# Patient Record
Sex: Male | Born: 1940 | Hispanic: No | Marital: Married | Smoking: Current some day smoker
Health system: Southern US, Community
[De-identification: ages and names within clinical notes are randomized; demographics above are authoritative.]

## PROBLEM LIST (undated history)

## (undated) DIAGNOSIS — J189 Pneumonia, unspecified organism: Secondary | ICD-10-CM

## (undated) DIAGNOSIS — R9389 Abnormal findings on diagnostic imaging of other specified body structures: Secondary | ICD-10-CM

## (undated) DIAGNOSIS — J449 Chronic obstructive pulmonary disease, unspecified: Secondary | ICD-10-CM

## (undated) HISTORY — PX: LAPAROTOMY: SHX154

## (undated) HISTORY — DX: Abnormal findings on diagnostic imaging of other specified body structures: R93.89

## (undated) HISTORY — DX: Pneumonia, unspecified organism: J18.9

---

## 2009-11-07 ENCOUNTER — Encounter: Admission: RE | Admit: 2009-11-07 | Discharge: 2009-11-07 | Payer: Self-pay | Admitting: Family Medicine

## 2009-11-11 ENCOUNTER — Ambulatory Visit: Payer: Self-pay | Admitting: Pulmonary Disease

## 2009-11-11 DIAGNOSIS — J438 Other emphysema: Secondary | ICD-10-CM
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
## 2010-03-16 ENCOUNTER — Encounter (HOSPITAL_COMMUNITY): Payer: Self-pay | Admitting: Radiology

## 2010-03-16 ENCOUNTER — Inpatient Hospital Stay (HOSPITAL_COMMUNITY)
Admission: EM | Admit: 2010-03-16 | Discharge: 2010-03-19 | DRG: 190 | Disposition: A | Discharge status: Home / Self Care | Payer: Medicare PPO | Attending: Internal Medicine | Admitting: Internal Medicine

## 2010-03-16 ENCOUNTER — Emergency Department (HOSPITAL_COMMUNITY): Payer: Medicare PPO

## 2010-03-16 DIAGNOSIS — R634 Abnormal weight loss: Secondary | ICD-10-CM | POA: Diagnosis present

## 2010-03-16 DIAGNOSIS — R222 Localized swelling, mass and lump, trunk: Secondary | ICD-10-CM | POA: Diagnosis present

## 2010-03-16 DIAGNOSIS — I214 Non-ST elevation (NSTEMI) myocardial infarction: Secondary | ICD-10-CM | POA: Diagnosis present

## 2010-03-16 DIAGNOSIS — R918 Other nonspecific abnormal finding of lung field: Secondary | ICD-10-CM

## 2010-03-16 DIAGNOSIS — J449 Chronic obstructive pulmonary disease, unspecified: Secondary | ICD-10-CM

## 2010-03-16 DIAGNOSIS — J441 Chronic obstructive pulmonary disease with (acute) exacerbation: Principal | ICD-10-CM | POA: Diagnosis present

## 2010-03-16 DIAGNOSIS — R0602 Shortness of breath: Secondary | ICD-10-CM

## 2010-03-16 DIAGNOSIS — R7989 Other specified abnormal findings of blood chemistry: Secondary | ICD-10-CM

## 2010-03-16 DIAGNOSIS — F17200 Nicotine dependence, unspecified, uncomplicated: Secondary | ICD-10-CM | POA: Diagnosis present

## 2010-03-16 DIAGNOSIS — IMO0002 Reserved for concepts with insufficient information to code with codable children: Secondary | ICD-10-CM

## 2010-03-16 DIAGNOSIS — Z7982 Long term (current) use of aspirin: Secondary | ICD-10-CM

## 2010-03-16 LAB — MAGNESIUM: Magnesium: 2.2 mg/dL (ref 1.5–2.5)

## 2010-03-16 LAB — BASIC METABOLIC PANEL
CO2: 26 mEq/L (ref 19–32)
Calcium: 8.8 mg/dL (ref 8.4–10.5)
Chloride: 101 mEq/L (ref 96–112)
GFR calc Af Amer: >60 mL/min (ref >60)
Glucose, Bld: 143 mg/dL — ABNORMAL HIGH (ref 70–99)
Sodium: 138 mEq/L (ref 135–145)

## 2010-03-16 LAB — POCT I-STAT 3, ART BLOOD GAS (G3+)
O2 Saturation: 93 %
TCO2: 26 mmol/L (ref 0–100)
pCO2 arterial: 46.1 mmHg — ABNORMAL HIGH (ref 35.0–45.0)
pH, Arterial: 7.343 — ABNORMAL LOW (ref 7.350–7.450)
pO2, Arterial: 72 mmHg — ABNORMAL LOW (ref 80.0–100.0)

## 2010-03-16 LAB — CARDIAC PANEL(CRET KIN+CKTOT+MB+TROPI)
CK, MB: 20.6 ng/mL (ref 0.3–4.0)
Relative Index: 1.1 (ref 0.0–2.5)
Relative Index: 1.7 (ref 0.0–2.5)
Total CK: 1194 U/L — ABNORMAL HIGH (ref 7–232)
Troponin I: 1.61 ng/mL (ref 0.00–0.06)

## 2010-03-16 LAB — CK TOTAL AND CKMB (NOT AT ARMC)
CK, MB: 10.9 ng/mL (ref 0.3–4.0)
Total CK: 1132 U/L — ABNORMAL HIGH (ref 7–232)

## 2010-03-16 LAB — DIFFERENTIAL
Basophils Absolute: 0 10*3/uL (ref 0.0–0.1)
Basophils Relative: 0 % (ref 0–1)
Lymphocytes Relative: 20 % (ref 12–46)
Monocytes Absolute: 0.7 10*3/uL (ref 0.1–1.0)
Monocytes Relative: 7 % (ref 3–12)
Neutro Abs: 7 10*3/uL (ref 1.7–7.7)
Neutrophils Relative %: 65 % (ref 43–77)

## 2010-03-16 LAB — TROPONIN I: Troponin I: 0.77 ng/mL (ref 0.00–0.06)

## 2010-03-16 LAB — CBC
HCT: 45.2 % (ref 39.0–52.0)
Hemoglobin: 15.2 g/dL (ref 13.0–17.0)
MCH: 30.1 pg (ref 26.0–34.0)
MCHC: 33.6 g/dL (ref 30.0–36.0)
RBC: 5.05 MIL/uL (ref 4.22–5.81)

## 2010-03-16 LAB — HEPARIN LEVEL (UNFRACTIONATED): Heparin Unfractionated: <0.1 IU/mL — ABNORMAL LOW (ref 0.30–0.70)

## 2010-03-16 LAB — PHOSPHORUS: Phosphorus: 2.1 mg/dL — ABNORMAL LOW (ref 2.3–4.6)

## 2010-03-16 MED ORDER — IOHEXOL 300 MG/ML  SOLN: 100.0000 mL | Freq: Once | INTRAMUSCULAR | Status: AC | PRN

## 2010-03-17 DIAGNOSIS — R0602 Shortness of breath: Secondary | ICD-10-CM

## 2010-03-17 DIAGNOSIS — R05 Cough: Secondary | ICD-10-CM

## 2010-03-17 DIAGNOSIS — J441 Chronic obstructive pulmonary disease with (acute) exacerbation: Secondary | ICD-10-CM

## 2010-03-17 LAB — COMPREHENSIVE METABOLIC PANEL
Albumin: 3.1 g/dL — ABNORMAL LOW (ref 3.5–5.2)
BUN: 10 mg/dL (ref 6–23)
Calcium: 8.9 mg/dL (ref 8.4–10.5)
Chloride: 104 mEq/L (ref 96–112)
Creatinine, Ser: 0.79 mg/dL (ref 0.4–1.5)
GFR calc Af Amer: >60 mL/min (ref >60)
Total Bilirubin: 0.6 mg/dL (ref 0.3–1.2)

## 2010-03-17 LAB — LIPID PANEL
HDL: 46 mg/dL (ref >39)
LDL Cholesterol: 72 mg/dL (ref 0–99)
Triglycerides: 32 mg/dL (ref <150)
VLDL: 6 mg/dL (ref 0–40)

## 2010-03-17 LAB — CBC
MCH: 29.7 pg (ref 26.0–34.0)
MCHC: 33.8 g/dL (ref 30.0–36.0)
Platelets: 203 10*3/uL (ref 150–400)
RDW: 14.4 % (ref 11.5–15.5)

## 2010-03-17 LAB — HEPARIN LEVEL (UNFRACTIONATED): Heparin Unfractionated: 0.12 IU/mL — ABNORMAL LOW (ref 0.30–0.70)

## 2010-03-18 ENCOUNTER — Other Ambulatory Visit (HOSPITAL_COMMUNITY): Payer: Medicare PPO

## 2010-03-18 ENCOUNTER — Telehealth: Payer: Self-pay | Admitting: Pulmonary Disease

## 2010-03-18 DIAGNOSIS — D381 Neoplasm of uncertain behavior of trachea, bronchus and lung: Secondary | ICD-10-CM

## 2010-03-18 LAB — CBC
HCT: 34.7 % — ABNORMAL LOW (ref 39.0–52.0)
MCHC: 33.7 g/dL (ref 30.0–36.0)
MCV: 88.1 fL (ref 78.0–100.0)
Platelets: 186 10*3/uL (ref 150–400)
RDW: 14.7 % (ref 11.5–15.5)

## 2010-03-19 ENCOUNTER — Inpatient Hospital Stay (HOSPITAL_COMMUNITY): Payer: Medicare PPO

## 2010-03-19 LAB — CBC
MCH: 29.9 pg (ref 26.0–34.0)
MCV: 90 fL (ref 78.0–100.0)
Platelets: 179 10*3/uL (ref 150–400)
RDW: 15 % (ref 11.5–15.5)

## 2010-03-21 ENCOUNTER — Telehealth: Payer: Self-pay | Admitting: Pulmonary Disease

## 2010-03-23 NOTE — H&P (Signed)
NAMEJESS, TONEY               ACCOUNT NO.:  [id number]  MEDICAL RECORD NO.:  [id number]           PATIENT TYPE:  E  LOCATION:  [facility]                         FACILITY:  [facility]  PHYSICIAN:  Michiel Cowboy, MDDATE OF BIRTH:  12-09-1940  DATE OF ADMISSION:  03/16/2010 DATE OF DISCHARGE:                             HISTORY & PHYSICAL   PRIMARY CARE PROVIDER:  Dr. Winfield Cunas at [facility].  CHIEF COMPLAINT:  Sudden onset of shortness of breath.  The patient is a 70 year old gentleman with history of COPD, continues to smoke.  He has had some cough for quite some time, but no fevers or chills.  He was sleeping at 1 a.m., he developed sudden onset of severe onset of coughing and shortness of breath.  He could not catch his breath.  He tried to use albuterol inhaler, but was found himself unable to do so secondary to severe shortness of breath.  He finally called out for help and EMS was called and he was brought into the emergency department.  En route, he was given IV steroids.  At first, his respiratory distress was severe and it was thought to be even he may need to be resuscitated, but he was able to improve on Xopenex nebs and actually currently almost back to his baseline.  He is resting on 2 liters of oxygen, able to speak in complete sentences, although still has some wheezing, but in no respiratory distress, whatsoever.  [facility] was called for admission.  The patient does endorse 20-pound weight loss.  Absolutely, no chest pain associated with this.  No lower extremity edema.  No abdominal discomfort.  No other complaints.  PAST MEDICAL HISTORY:  Mainly significant for COPD.  He denies any other medical history.  SOCIAL HISTORY:  The patient continues to smoke, but will quit time and very encouraged to discontinue smoking.  FAMILY HISTORY:  Noncontributory.  ALLERGIES:  No known drug allergies.  MEDICATIONS: 1. Advair 150 mcg twice a day. 2.  Albuterol as needed. 3. Spiriva daily.  PHYSICAL EXAMINATION:  VITAL SIGNS:  Temperature 97.7, blood pressure 149/88, pulse 118, respirations 24, and saturating 97% on 2 liters. GENERAL:  The patient currently appears to be in no acute distress.  He does get slightly short winded in the long sentences. HEAD:  Nontraumatic.  Slightly dryish mucous membranes. LUNGS:  Distant breath sounds with wheezes bilaterally. HEART:  Regular rate and rhythm, but rapid. ABDOMEN:  Soft and nondistended. EXTREMITIES:  Lower extremities without clubbing, cyanosis, or edema. NEUROLOGICAL:  Grossly intact.  Overall, the patient appears to be frail and thin.  LABORATORY DATA:  White blood cell count 10.7 and hemoglobin 15.5. Sodium 138, potassium 4.0, creatinine 1.09.  Chest x-ray showing infection versus edema and COPD.  ASSESSMENT/PLAN:  This is a 70 year old gentleman, most likely chronic obstructive pulmonary disease exacerbation, but given sudden onset we will also evaluate for other things as well.  We will treat his COPD with steroids IV and Xopenex.  We will try to avoid albuterol given his tachycardia, Atrovent and continue his Advair.  He may benefit from pulmonology consult.  He strongly feels that he is going to be able to quit smoking, but we will provide tobacco cessation consult and also nicotine patch.  We will put him on Avelox for typical infection coverage.  Give Mucinex and oxygen.  The patient will need to go home with nebulizers and maybe even home O2.  Given his weight loss and sudden onset of his symptoms, we will obtain CTA of the chest to evaluate for PE and also evaluate for any masses given he has high risk for carcinoma.  Elevated blood pressure is currently resolved that was brought in the setting of distress.  Prophylaxis.  Protonix and Lovenox.  We will cycle cardiac markers given sudden onset shortness of breath, although mostly suspect this is COPD related.  The  patient also may have a pulmonologist, Dr. Shelle Iron.  He is not sure about the name.     Michiel Cowboy, MD     AVD/MEDQ  D:  03/16/2010  T:  03/16/2010  Job:  [id number]  cc:   [name]  Electronically Signed by Therisa Doyne MD on 03/22/2010 07:26:36 PM

## 2010-03-24 ENCOUNTER — Inpatient Hospital Stay (INDEPENDENT_AMBULATORY_CARE_PROVIDER_SITE_OTHER): Payer: Medicare PPO | Admitting: Adult Health

## 2010-03-24 ENCOUNTER — Encounter: Payer: Self-pay | Admitting: Adult Health

## 2010-03-24 DIAGNOSIS — J438 Other emphysema: Secondary | ICD-10-CM

## 2010-03-24 DIAGNOSIS — J984 Other disorders of lung: Secondary | ICD-10-CM

## 2010-03-25 DIAGNOSIS — R911 Solitary pulmonary nodule: Secondary | ICD-10-CM | POA: Insufficient documentation

## 2010-03-27 ENCOUNTER — Encounter (HOSPITAL_COMMUNITY)
Admit: 2010-03-27 | Discharge: 2010-03-27 | Disposition: A | Discharge status: Home / Self Care | Payer: Medicare PPO | Source: Ambulatory Visit | Attending: Internal Medicine | Admitting: Internal Medicine

## 2010-03-27 ENCOUNTER — Encounter (HOSPITAL_COMMUNITY): Payer: Self-pay

## 2010-03-27 DIAGNOSIS — J438 Other emphysema: Secondary | ICD-10-CM | POA: Insufficient documentation

## 2010-03-27 DIAGNOSIS — R222 Localized swelling, mass and lump, trunk: Secondary | ICD-10-CM | POA: Insufficient documentation

## 2010-03-27 DIAGNOSIS — K5730 Diverticulosis of large intestine without perforation or abscess without bleeding: Secondary | ICD-10-CM | POA: Insufficient documentation

## 2010-03-27 DIAGNOSIS — R0602 Shortness of breath: Secondary | ICD-10-CM | POA: Insufficient documentation

## 2010-03-27 MED ORDER — FLUDEOXYGLUCOSE F - 18 (FDG) INJECTION
18.2000 | Freq: Once | INTRAVENOUS | Status: AC | PRN
  Administered 2010-03-27: 18.2 via INTRAVENOUS

## 2010-03-27 NOTE — Progress Notes (Signed)
Summary: Antibiotic  Phone Note Call from Patient Call back at Home Phone [phone] Call back at [phone]   Caller: Patient Reason for Call: Referral Summary of Call: Was discharged from [facility] yesterday, was prescribed very expensive antibiotic ($20/pill), and wants to know if he can avoid taking two more until patient comes for visit on Monday. Initial call taken by: Leonette Monarch,  March 21, 2010 12:07 PM  Follow-up for Phone Call        spoke with and he states that he cannot afford avelox and he needs 2 days worth. I left samples at front for pt. he has hfu on monday.Carron Curie CMA  March 21, 2010 12:37 PM

## 2010-03-27 NOTE — Progress Notes (Signed)
Summary: which dr for pt? FYI  Phone Note From Other Clinic   Caller: pete babcock Call For: Chad Velazquez / wert Summary of Call: Just an FYI for dr's. pete has scheduled a HFU w/ tp on 2/13. he also requested that pt have an ov w/ wert which i scheduled for 2/28. i did explain to pete that this is a pt of [facility]. pete says wert had been following pt and requested that i go ahead and schedule this.  Initial call taken by: Tivis Ringer, CNA,  March 18, 2010 11:24 AM  Follow-up for Phone Call        [facility] requested this pt be put on Dr. Sherene Sires schedule for a HFU, but he saw you in October for a new consult.  Nicholos Johns said that MW saw the pt in the hospital, which I do not see any records of. Do you know anything about this? Shouldn't this pt be placed on your schedule? Please advise. Carron Curie CMA  March 18, 2010 1:27 PM   Additional Follow-up for Phone Call Additional follow up Details #1::        I have seen the pt in the office and am happy to continue seeing him for continuity.  Would go ahead and put on my schedule unless pt wishes to see wert instead of me. Additional Follow-up by: Barbaraann Share MD,  March 18, 2010 2:19 PM    Additional Follow-up for Phone Call Additional follow up Details #2::    Pt appt changed from MW to [facility]. Pt set for 04-09-10 at 3:00. I called [facility] pt is in room # [phone] and advised his nurse of the change in appt time and date. She will notify the pt. Carron Curie CMA  March 18, 2010 2:52 PM

## 2010-03-28 ENCOUNTER — Telehealth: Payer: Self-pay | Admitting: Internal Medicine

## 2010-03-31 ENCOUNTER — Encounter: Payer: Medicare PPO | Admitting: Internal Medicine
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
## 2010-04-02 NOTE — Progress Notes (Signed)
Summary: results/ pt returned call  Phone Note Call from Patient   Caller: Patient Call For: Chad Velazquez Summary of Call: pt returned call from nurse re: results. call [phone] Initial call taken by: Tivis Ringer, CNA,  March 28, 2010 3:07 PM  Follow-up for Phone Call        called and spoke with pt and he is aware per MW of cxr results and appt has been made on 2/27 at 9:45- to discuss meds for copd--pt requested that he just cont to see MW and cancel the appt with [facility] on 2/29.   Randell Loop [facility]  March 28, 2010 3:51 PM
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
## 2010-04-07 ENCOUNTER — Encounter: Payer: Self-pay | Admitting: Internal Medicine

## 2010-04-07 ENCOUNTER — Ambulatory Visit (INDEPENDENT_AMBULATORY_CARE_PROVIDER_SITE_OTHER): Payer: Medicare PPO | Admitting: Internal Medicine

## 2010-04-07 DIAGNOSIS — F17200 Nicotine dependence, unspecified, uncomplicated: Secondary | ICD-10-CM | POA: Insufficient documentation

## 2010-04-07 DIAGNOSIS — J438 Other emphysema: Secondary | ICD-10-CM

## 2010-04-08 ENCOUNTER — Ambulatory Visit: Payer: Medicare PPO | Admitting: Internal Medicine

## 2010-04-09 ENCOUNTER — Ambulatory Visit: Payer: Medicare PPO | Admitting: Pulmonary Disease

## 2010-04-17 NOTE — Assessment & Plan Note (Signed)
Summary: Pulmonary/ ext post hosp f/u ov hfa 75%   Copy to:  [name] Primary Provider/Referring Provider:  [name] MD  CC:  Cough and edema- resolved.  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  April 07, 2010  1st pulmonary office eval post hosp now maintained spiriva and symbicort with no symptoms at f/u including no limiting sob, no cough. Pt denies any significant sore throat, dysphagia, itching, sneezing,  nasal congestion or excess secretions,  fever, chills, sweats, unintended wt loss, pleuritic or exertional cp, hempoptysis, change in activity tolerance  orthopnea pnd or leg swelling Pt also denies any obvious fluctuation in symptoms with weather or environmental change or other alleviating or aggravating factors.       Preventive Screening-Counseling & Management  Alcohol-Tobacco     Smoking Status: current     Smoking Cessation Counseling: yes  Current Medications (verified): 1)  Spiriva Handihaler 18 Mcg  Caps (Tiotropium Bromide Monohydrate) .... One Puff  in Handihaler Daily 2)  Symbicort 160-4.5 Mcg/act Aero (Budesonide-Formoterol Fumarate) .... Inhale 2 Puffs Two Times A Day 3)  Diltiazem Hcl Er Beads 180 Mg Xr24h-Cap (Diltiazem Hcl Er Beads) .... Take 1 Capsule By Mouth Once A Day 4)  Mucinex Dm 30-600 Mg Xr12h-Tab (Dextromethorphan-Guaifenesin) .... Take 1-2 Tablets Every 12 Hours As Needed 5)  Pantoprazole Sodium 40 Mg Tbec (Pantoprazole Sodium) .... Take 1 Tablet By Mouth Once A Day Before First Meal of The Day 6)  Pravachol 40 Mg Tabs (Pravastatin Sodium) .... Take 1 Tab By Mouth At Bedtime 7)  Alprazolam 0.5 Mg Tabs (Alprazolam) .... 1/2 Once Daily As Needed 8)  Multivitamins   Tabs (Multiple Vitamin) .... Take 1 Tablet By Mouth Once A Day 9)  Proair Hfa 108 (90 Base) Mcg/act  Aers (Albuterol Sulfate) .... 2 Puffs Every 4-6 Hours As Needed 10)   Prednisone 10 Mg Tabs (Prednisone) .Marland Kitchen.. 1 Once Daily 11)  Px Enteric Aspirin 81 Mg Tbec (Aspirin) .Marland Kitchen.. 1 Once Daily  Allergies (verified): No Known Drug Allergies  Past History:  Past Medical History: Emphysema      - PFT's April 07, 2010  Abn CT chest      - F/u PET c/w resolving pna 03/27/2010  Family History: Reviewed history from 11/11/2009 and no changes required. 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 1 ppd. Start age 41 Smoking Status:  current  Vital Signs:  Patient profile:   70 year old male Weight:      141.25 pounds O2 Sat:      97 % on Room air Temp:     97.6 degrees F oral Pulse rate:   89 / minute BP sitting:   140 / 74  (left arm)  Vitals Entered By: Vernie Murders (April 07, 2010 9:48 AM)  O2 Flow:  Room air  Physical Exam  Additional Exam:  wt 141 April 07, 2010 HEENT mild turbinate edema.  Oropharynx no thrush or excess pnd or cobblestoning.  No JVD or cervical adenopathy. Mild accessory muscle hypertrophy. Trachea midline, nl thryroid. Chest was hyperinflated by percussion with diminished breath sounds and moderate increased exp time without wheeze. Hoover sign positive at mid inspiration. Regular rate and rhythm without murmur gallop or rub or increase P2 or edema.  Abd: no hsm, nl excursion. Ext warm without cyanosis or  clubbing.     Impression & Recommendations:  Problem # 1:  EMPHYSEMA (ICD-492.8)  clinically moderate but needs pft's for staging    DDX of  difficult airways managment all start with A and  include Adherence, Ace Inhibitors, Acid Reflux, Active Sinus Disease, Alpha 1 Antitripsin deficiency, Anxiety masquerading as Airways dz,  ABPA,  allergy(esp in young), Aspiration (esp in elderly), Adverse effects of DPI,  Active smokers, plus two Bs  = Bronchiectasis and Beta blocker use..and one C= CHF   Active smoking obviously the biggest concern  here see #2  Adherence:  I spent extra time with the patient today explaining optimal mdi  technique.  This improved from  50-75%  Orders: Est. Patient Level IV (16109) HFA Instruction [phone])  Problem # 2:  SMOKER (ICD-305.1)  I emphasized that although we never turn away smokers from the pulmonary clinic, we do ask that they understand that the recommendations that were made won't work nearly as well in the presence of continued cigarette exposure and we may reach a point where we can't help the patient if he/she can't quit smoking.    Medications Added to Medication List This Visit: 1)  Alprazolam 0.5 Mg Tabs (Alprazolam) .... 1/2 once daily as needed 2)  Prednisone 10 Mg Tabs (Prednisone) .Marland Kitchen.. 1 once daily 3)  Px Enteric Aspirin 81 Mg Tbec (Aspirin) .Marland Kitchen.. 1 once daily  Patient Instructions: 1)  Stop smoking before it stops you 2)  Work on inhaler technique:  relax and blow all the way out then take a nice smooth deep breath back in, triggering the inhaler at same time you start breathing in  3)  Prednsione 10 mg  one half daily x 5 days stop  4)  Please schedule a follow-up appointment in 4-6 weeks, sooner if needed with pft's and cxr on return.

## 2010-04-30 NOTE — Discharge Summary (Signed)
Chad Velazquez, LEITZ               ACCOUNT NO.:  [id number]  MEDICAL RECORD NO.:  [id number]           PATIENT TYPE:  I  LOCATION:  4735                         FACILITY:  [facility]  PHYSICIAN:  Casimiro Needle B. Sherene Sires, MD, FCCPDATE OF BIRTH:  06/[birth year]/39  DATE OF ADMISSION:  03/16/2010 DATE OF DISCHARGE:  03/19/2010                              DISCHARGE SUMMARY   DISCHARGE DIAGNOSES: 1. Acute exacerbation of chronic obstructive pulmonary disease. 2. Non-ST-elevation myocardial infarction. 3. Left apical lung mass.  CONSULTANTS: 1. Bevelyn Buckles. Bensimhon, MD, with [facility]. 2. Rachelle Hora. Lowella Dandy, MD, with Interventional Rad[facility].  LABORATORY DATA:  Date March 19, 2010, white blood cell count 17.3, hemoglobin 12, hematocrit 36.1, platelet count 179,000.  Date March 17, 2010, sodium 137, potassium 4.3, chloride 104, CO2 24, glucose 132, BUN is 10, creatinine 0.79.  Echocardiogram, this was obtained on March 17, 2010, the results are left ventricular cavity wall size was normal, EF was 60%.  There was no TR.  The IVC was dilated.  RADIOLOGY:  CT angiogram on March 16, 2010, this demonstrated a large mass-like area in the left apex and this was located anteriorly.  There was no evidence of pulmonary emboli.  There was noted severe emphysematous changes in the long.  BRIEF HISTORY:  A 70 year old male patient with a known history of chronic obstructive pulmonary disease and active smoking, presents with chief complaint of cough and sudden onset of severe coughing and dyspnea which woke him up at approximately 1 a.m. on hospital admit day on March 16, 2010.  He had resultant severe dyspnea.  This did not respond to his rescue bronchodilator therapy.  He therefore summoned EMS.  He was transferred to the emergency room at [facility]. He was given inhaled bronchodilators and IV steroids en route.  He continued to improve over the course of his emergency room  visit, however, required hospital at admit for further titration of therapy.  HOSPITAL COURSE BY DISCHARGE DIAGNOSES: 1. Acute exacerbation of chronic obstructive pulmonary disease.  Mr.     [name] was admitted to the regular medical ward.  Therapeutic     interventions included supplemental oxygen, inhaled     bronchodilators, systemic steroids, and empiric antibiotics.  He     continued to improve over the course of his hospitalization.     Please note, there was a significant component of what appeared to     be upper airway irritation/probable vocal cord dysfunction     complicating Mr. [name]' progress.  Because of this, his powder     inhalers were discontinued.  He had been on Advair in the     outpatient setting which may have been contributing to upper airway     irritation.  He has had some difficulty with medication compliance     in the outpatient setting, particularly with his inhaler regimen.     Ultimately, he continued to improve over the course of his     hospitalization.  He has been since transitioned to what will be     his maintenance bronchodilator regimen which will be a triple combo  therapy including inhaled anticholinergics, inhaled     corticosteroids, and long-acting beta-agonists.  Additionally, of     course he will be sent with recommendations to continue short-     acting beta-agonists for rescue dyspnea.  He will continue a slow     prednisone taper, and finish two more days of empiric antibiotics     in the form of Avelox.  He again has been reminded of the     importance of smoking cessation, and he will be followed through     the outpatient setting in our office for further COPD management. 2. Acute non-ST-elevation MI.  This was seen primarily from elevated     cardiac enzymes during his hospitalization.  Please note, he was     asymptomatic with the exception of dyspnea.  Because of this,     [facility] was obtained.  Echocardiogram was  completed.  He     will be followed by [name] in the outpatient setting     for possible catheterization pending the results of his PET scan.     For now, he will be discharged to home on calcium channel blocker,     Pravachol, and aspirin 81 mg daily. 3. Left apical lung mass.  This was identified by CAT scan.  He has a     PET scan ordered in the outpatient setting to help better evaluate     the potential for malignancy.  This is scheduled for March 27, 2010, at 10 a.m.  Following that, he will be followed by     Interventional Radiology.  He has been seen by Dr. Richarda Overlie in     evaluation for this.  His discharge instructions are as follows: 1. Increase activity as tolerated. 2. Stop smoking. 3. Follow up nurse practitioner, [name], Monday, March 24, 2010, at 2 p.m.; [name] on April 09, 2010, at 3 p.m.;     [name] on April 01, 2010, at 2 p.m.  Additionally,     he has his PET scan scheduled for March 27, 2010, at 10 a.m. at     [facility].  He has been instructed to be n.p.o. 6 hours     prior to his PET scan.  Discharge medications are as follows: 1. Budesonide/formoterol (Symbicort) 160/4.5 two puffs inhaled twice a     day. 2. Diltiazem ER 180 mg daily. 3. Mucinex DM 2 tablets twice a day. 4. Avelox 400 mg daily for 2 days. 5. Pravachol 40 mg daily. 6. Pantoprazole 40 mg daily. 7. Prednisone taper 10 mg tablets, instructed to take 4 tablets daily     for 3 days, 3 tablets daily for 3 days, 2 tablets daily for 3 days,     1 tablet daily for 3 days, then discontinue. 8. Albuterol inhaler 2 puffs every 4 hours as needed. 9. Multivitamin daily. 10.Spiriva 18 mcg cap inhaled daily. 11.Aspirin 81 mg daily.  He has been instructed to discontinue his Advair.  DISPOSITION:  [name] has met maximum benefit from inpatient care. He is now medically cleared for discharge with follow up as  mentioned above.  Please note 30 minutes of time have been dedicated to discharge assessment and plan.     Zenia Resides, NP   ______________________________ Charlaine Dalton. Sherene Sires, MD, FCCP    PB/MEDQ  D:  03/19/2010  T:  03/19/2010  Job:  [id number]  cc:   Barbaraann Share, MD,FCCP Arn Medal, MD Bevelyn Buckles. Bensimhon, MD  Electronically Signed by Zenia Resides NP on 04/28/2010 09:59:49 PM Electronically Signed by Sandrea Hughs MD FCCP on 04/30/2010 09:53:39 PM

## 2010-08-22 ENCOUNTER — Telehealth: Payer: Self-pay | Admitting: Internal Medicine

## 2010-08-22 NOTE — Telephone Encounter (Signed)
I spoke with the pt and he states that the spiriva and proair is too expensive and is requesting alternatives. I advised the pt that he is overdue for an appt. So I advised to bring his drug formulary with him and appt set for 08/27/10 at 2pm. Pt saw [facility] for consult and then [facility] for HFU but states he wants to see [facility] not [facility]. Carron Curie, CMA

## 2010-08-26 ENCOUNTER — Encounter: Payer: Self-pay | Admitting: Pulmonary Disease

## 2010-08-27 ENCOUNTER — Encounter: Payer: Self-pay | Admitting: Pulmonary Disease

## 2010-08-27 ENCOUNTER — Ambulatory Visit (INDEPENDENT_AMBULATORY_CARE_PROVIDER_SITE_OTHER): Payer: Medicare PPO | Admitting: Pulmonary Disease

## 2010-08-27 VITALS — BP 130/80 | HR 103 | Temp 98.0°F | Ht 72.5 in | Wt 128.6 lb

## 2010-08-27 DIAGNOSIS — J984 Other disorders of lung: Secondary | ICD-10-CM

## 2010-08-27 DIAGNOSIS — J438 Other emphysema: Secondary | ICD-10-CM

## 2010-08-27 MED ORDER — ALBUTEROL SULFATE HFA 108 (90 BASE) MCG/ACT IN AERS: 2.0000 | INHALATION_SPRAY | Freq: Four times a day (QID) | RESPIRATORY_TRACT | Status: DC | PRN

## 2010-08-27 MED ORDER — BUPROPION HCL ER (SR) 150 MG PO TB12: ORAL_TABLET | ORAL | Status: DC

## 2010-08-27 NOTE — Patient Instructions (Signed)
Will keep on spiriva one each am, ventolin for rescue, and see if we can get you on symbicort thru the patient assistance program.  We do not have samples of symbicort today, so will give you dulera 100/5  2 inhalations am and pm in the interim.  Keep mouth rinsed. Take wellbutrin as directed, and work on smoking cessation. followup with me in 6mos  If doing well.

## 2010-08-27 NOTE — Progress Notes (Signed)
  Subjective:    Patient ID: Chad Velazquez, male    DOB: 11/22/1940, 70 y.o.   MRN: [id number]  HPI The pt comes in today for f/u of his copd.  He is still smoking, and now having financial issues which inteferes with getting meds. His chronic doe is at baseline, as is his cough with nonpurulent mucus.  He has not had an acute exacerbation since his hospitalization earlier in the year.  He wishes to discuss medication alternatives that may be less expensive.    Review of Systems  Constitutional: Negative for fever and unexpected weight change.  HENT: Negative for ear pain, nosebleeds, congestion, sore throat, rhinorrhea, sneezing, trouble swallowing, dental problem, postnasal drip and sinus pressure.   Eyes: Negative for redness and itching.  Respiratory: Positive for cough and shortness of breath. Negative for chest tightness and wheezing.   Cardiovascular: Negative for palpitations and leg swelling.  Gastrointestinal: Negative for nausea and vomiting.  Genitourinary: Negative for dysuria.  Musculoskeletal: Negative for joint swelling.  Skin: Negative for rash.  Neurological: Negative for headaches.  Hematological: Does not bruise/bleed easily.  Psychiatric/Behavioral: Negative for dysphoric mood. The patient is not nervous/anxious.        Objective:   Physical Exam Thin male in nad Nares without purulence or discharge Chest with decreased bs, no wheezing or rhonchi Cor with rrr, no mrg LE without edema, no cyanosis Alert, oriented, moves all 4        Assessment & Plan:

## 2010-08-30 NOTE — Assessment & Plan Note (Signed)
The pt has emphysema on clinical grounds with significant doe.  Unfortunately, he continues to smoke.  He is having financial difficulties, and issues affording his medications.  I would like him to stay on spiriva and LABA/ICS, and will try to get him on some type of pt assistance program.  I did re-iterate to him smoking cessation is the most important treatment, and that he can take the money saved and put toward his medications. I have had a long discussion with him about management, the importance of avoiding acute exacerbations, and the role of ongoing tobacco abuse.  Time spent with pt today in discussions was .

## 2010-09-29 ENCOUNTER — Telehealth: Payer: Self-pay | Admitting: Pulmonary Disease

## 2010-09-29 MED ORDER — TIOTROPIUM BROMIDE MONOHYDRATE 18 MCG IN CAPS: 18.0000 ug | ORAL_CAPSULE | Freq: Every day | RESPIRATORY_TRACT | Status: DC

## 2010-09-29 NOTE — Telephone Encounter (Signed)
Pt calling for prescriptions to be sent to pharmacy for Spiriva and Symbicort. I sent in Spiriva electronic, however, need verification on Symbicort strength as per pt is 80 and our records indicate an "error" of 160. KC please advise on Symbicort.   FYI-Pt states he has been "sitting on his paperwork" x 3 weeks for the patient assistance program for Symbicort and will mail it tomorrow. Pt states feels like he will qualify for Symbicort and not Spiriva. Has "17 days" left for Durlera samples and is completely out of Spirva.

## 2010-09-29 NOTE — Telephone Encounter (Signed)
Everything I see is 160/4.5 in chart.  I wonder if he got samples from [place] for that because that is all we had, or if another md gave him this.  Needs to be 160/4.5

## 2010-09-30 NOTE — Telephone Encounter (Signed)
lmomtcb x1 

## 2010-10-01 ENCOUNTER — Telehealth: Payer: Self-pay | Admitting: Pulmonary Disease

## 2010-10-01 ENCOUNTER — Encounter: Payer: Self-pay | Admitting: Emergency Medicine

## 2010-10-01 ENCOUNTER — Ambulatory Visit (INDEPENDENT_AMBULATORY_CARE_PROVIDER_SITE_OTHER): Payer: Medicare PPO | Admitting: Emergency Medicine

## 2010-10-01 VITALS — BP 118/66 | HR 106 | Temp 98.4°F | Ht 73.0 in | Wt 125.8 lb

## 2010-10-01 DIAGNOSIS — J438 Other emphysema: Secondary | ICD-10-CM

## 2010-10-01 MED ORDER — MOXIFLOXACIN HCL 400 MG PO TABS: 400.0000 mg | ORAL_TABLET | Freq: Every day | ORAL | Status: AC

## 2010-10-01 NOTE — Assessment & Plan Note (Addendum)
Suspect bronchitis after a viral URI. Must also consider him at risk for a recurrent bullitis - avelox x 7 days - recommended a CXR but he wants to defer - underscored that a bullitis may need to be treated longer than a week and that he will have to give feedback to Dr Shelle Iron about whether he is better - rov w [facility] as already scheduled

## 2010-10-01 NOTE — Telephone Encounter (Signed)
Pt scheduled to see RB this afternoon at 3 pm.

## 2010-10-01 NOTE — Patient Instructions (Signed)
Please take Avelox 400mg  daily for the next 7 days. Please contact our office if you symptoms are not improving after the medication is completed.  Follow up with Dr Shelle Iron as already scheduled, or sooner if you have any problems.

## 2010-10-01 NOTE — Progress Notes (Signed)
  Subjective:    Patient ID: Chad Velazquez, male    DOB: [birth year], 70 y.o.   MRN: [id number]  HPI 70 yo active smoker, followed by Dr Shelle Iron for COPD, radiographical emphysema with associated DOE. 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 He presents today reporting that around 7/20 he developed chills, fever x 4 days, cough productive of dark phlegm, fatigue. Most of the illness got better, presumed it was a viral URI. He is left with a residual cough that is productive of dark yellow phlegm, more than his baseline. Remains fatigued. Not really wheezing. Some more dyspnea than baseline.     Review of Systems As above.      Objective:   Physical Exam Gen: Pleasant, thin, in no distress,  normal affect  ENT: No lesions,  mouth clear,  oropharynx clear, no postnasal drip  Neck: No JVD, no TMG, no carotid bruits  Lungs: No use of accessory muscles, minimal air movement L apex, no wheeze  Cardiovascular: RRR, heart sounds normal, no murmur or gallops, no peripheral edema  Musculoskeletal: No deformities, no cyanosis or clubbing  Neuro: alert, non focal  Skin: Warm, no lesions or rashes     Assessment & Plan:  EMPHYSEMA Suspect bronchitis after a viral URI. Must also consider him at risk for a recurrent bullitis - avelox x 7 days - recommended a CXR but he wants to defer - underscored that a bullitis may need to be treated longer than a week and that he will have to give feedback to Dr Shelle Iron about whether he is better - rov w [facility] as already scheduled

## 2010-10-01 NOTE — Telephone Encounter (Signed)
[id number]. Shereta Crothers, CMA  

## 2010-10-01 NOTE — Progress Notes (Signed)
Addended by: Leslye Peer on: 10/01/2010 03:47 PM   Modules accepted: Orders

## 2010-10-03 MED ORDER — BUDESONIDE-FORMOTEROL FUMARATE 160-4.5 MCG/ACT IN AERO: 2.0000 | INHALATION_SPRAY | Freq: Two times a day (BID) | RESPIRATORY_TRACT | Status: DC

## 2010-10-03 NOTE — Telephone Encounter (Signed)
Called, spoke with pt.  He is aware symbicort should be 160/4.5 strength and rx for this sent to [facility].  He verbalized understanding of this.

## 2010-11-11 ENCOUNTER — Ambulatory Visit (INDEPENDENT_AMBULATORY_CARE_PROVIDER_SITE_OTHER)
Admission: RE | Admit: 2010-11-11 | Discharge: 2010-11-11 | Disposition: A | Discharge status: Home / Self Care | Payer: Medicare PPO | Source: Ambulatory Visit | Attending: Adult Health | Admitting: Adult Health

## 2010-11-11 ENCOUNTER — Encounter: Payer: Self-pay | Admitting: Adult Health

## 2010-11-11 ENCOUNTER — Ambulatory Visit (INDEPENDENT_AMBULATORY_CARE_PROVIDER_SITE_OTHER): Payer: Medicare PPO | Admitting: Adult Health

## 2010-11-11 VITALS — BP 132/72 | HR 120 | Temp 97.0°F | Ht 73.0 in | Wt 122.6 lb

## 2010-11-11 DIAGNOSIS — R000 Tachycardia, unspecified: Secondary | ICD-10-CM

## 2010-11-11 DIAGNOSIS — J438 Other emphysema: Secondary | ICD-10-CM

## 2010-11-11 MED ORDER — AMOXICILLIN-POT CLAVULANATE 875-125 MG PO TABS: 1.0000 | ORAL_TABLET | Freq: Two times a day (BID) | ORAL | Status: DC

## 2010-11-11 MED ORDER — PREDNISONE 10 MG PO TABS: ORAL_TABLET | ORAL | Status: DC

## 2010-11-11 NOTE — Patient Instructions (Addendum)
Augmentin 875mg  Twice daily  For 7 days Mucinex DM Twice daily  As needed  Cough/congestion  Fluids and rest  Prednisone taper over next week.  Please contact office for sooner follow up if symptoms do not improve or worsen or seek emergency care  follow up Dr. Shelle Iron in 4 weeks and As needed    Late Add: xray returned with Bilateral UL PNA >extended Augmentin x 10 days  Pt aware, he will have close follow up in 1 week  If not improving will need to make ov sooner or be admitted.  Hold on steroid for now.

## 2010-11-11 NOTE — Progress Notes (Signed)
  Subjective:    Patient ID: Chad Velazquez, male    DOB: 14-Feb-1940, 70 y.o.   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   10/01/10  He presents today reporting that around 7/20 he developed chills, fever x 4 days, cough productive of dark phlegm, fatigue. Most of the illness got better, presumed it was a viral URI. He is left with a residual cough that is productive of dark yellow phlegm, more than his baseline. Remains fatigued. Not really wheezing. Some more dyspnea than baseline.   >>Avelox   11/11/2010 Acute OV  Pt complains of productive cough with tan colored sputum- seems to be worse at night  He also states that his chest feels sore.  Seen 6 weeks ago for bronchitis tx w/ Avelox, got better but cough never went away. Has quit smoking x 1 month.  Has increased cough esp at night. No hemoptysis. Increased wheezing and DOE for 2 days. Has increased SABA use last 2 days. No syncope or palpitations.  Today HR was noted to he tachycardiac, EKG was done w/ HR ~120. EKG w/ poor tracing however no acute changes noted.    Review of Systems Constitutional:   No  weight loss, night sweats,  Fevers, chills,  +fatigue, or  lassitude.  HEENT:   No headaches,  Difficulty swallowing,  Tooth/dental problems, or  Sore throat,                No sneezing, itching, ear ache, nasal congestion, post nasal drip,   CV:  No chest pain,  Orthopnea, PND, swelling in lower extremities, anasarca, dizziness, palpitations, syncope.   GI  No heartburn, indigestion, abdominal pain, nausea, vomiting, diarrhea, change in bowel habits, loss of appetite, bloody stools.   Resp:   No coughing up of blood.    No chest wall deformity  Skin: no rash or lesions.  GU: no dysuria, change in color of urine, no urgency or frequency.  No flank pain, no hematuria   MS:  No joint pain or swelling.  No decreased range of  motion.  No back pain.  Psych:  No change in mood or affect. No depression or anxiety.  No memory loss.          Objective:   Physical Exam GEN: A/Ox3; pleasant , NAD, well nourished   HEENT:  [place]/AT,  EACs-clear, TMs-wnl, NOSE-clear, THROAT-clear, no lesions, no postnasal drip or exudate noted.   NECK:  Supple w/ fair ROM; no JVD; normal carotid impulses w/o bruits; no thyromegaly or nodules palpated; no lymphadenopathy.  RESP  Coarse BS w/ few rhonchi, no wheeze. no accessory muscle use, no dullness to percussion  CARD:  RRR, no m/r/g  , no peripheral edema, pulses intact, no cyanosis or clubbing.  GI:   Soft & nt; nml bowel sounds; no organomegaly or masses detected.  Musco: Warm bil, no deformities or joint swelling noted.   Neuro: alert, no focal deficits noted.    Skin: Warm, no lesions or rashes         Assessment & Plan:

## 2010-11-12 NOTE — Progress Notes (Signed)
Visit reviewed, and agree with plans as outlined.

## 2010-11-12 NOTE — Assessment & Plan Note (Signed)
COPD exacerbation  Xray pending today  Tachycardia on exam, EKG w/ no acute process, suspect secondary to increased SABA use   Plan:  Augmentin 875mg  Twice daily  For 7 days Mucinex DM Twice daily  As needed  Cough/congestion  Fluids and rest  Prednisone taper over next week.  Please contact office for sooner follow up if symptoms do not improve or worsen or seek emergency care  follow up Dr. Shelle Iron in 4 weeks and As needed

## 2010-11-19 ENCOUNTER — Ambulatory Visit: Payer: Medicare PPO | Admitting: Pulmonary Disease

## 2010-11-21 ENCOUNTER — Ambulatory Visit (INDEPENDENT_AMBULATORY_CARE_PROVIDER_SITE_OTHER): Payer: Medicare PPO | Admitting: Pulmonary Disease

## 2010-11-21 ENCOUNTER — Encounter: Payer: Self-pay | Admitting: Pulmonary Disease

## 2010-11-21 DIAGNOSIS — J851 Abscess of lung with pneumonia: Secondary | ICD-10-CM

## 2010-11-21 DIAGNOSIS — J852 Abscess of lung without pneumonia: Secondary | ICD-10-CM

## 2010-11-21 DIAGNOSIS — J438 Other emphysema: Secondary | ICD-10-CM

## 2010-11-21 DIAGNOSIS — Z23 Encounter for immunization: Secondary | ICD-10-CM

## 2010-11-21 MED ORDER — AMOXICILLIN-POT CLAVULANATE 875-125 MG PO TABS: 1.0000 | ORAL_TABLET | Freq: Two times a day (BID) | ORAL | Status: AC

## 2010-11-21 NOTE — Progress Notes (Signed)
  Subjective:    Patient ID: Chad Velazquez, male    DOB: [birth year], 70 y.o.   MRN: [id number]  HPI Patient comes in today for followup of his known severe emphysema.  He was seen recently by our nurse practitioner with bilateral upper lobe infiltrates noted on chest x-ray.  This is most consistent with bullitis.  He was placed on a course of Augmentin that he is finishing up, and was to return today for a possible chest x-ray.  Since being on the antibiotics, he has noted decreased quantity of mucus, and it appears less purulent.  He has less congestion, and overall feels much better.  He has not seen worsening of his exertional tolerance.  I have explained to him this type of infection usually requires a prolonged course of antibiotics.   Review of Systems  Constitutional: Negative for fever and unexpected weight change.  HENT: Positive for trouble swallowing. Negative for ear pain, nosebleeds, congestion, sore throat, rhinorrhea, sneezing, dental problem, postnasal drip and sinus pressure.   Eyes: Negative for redness and itching.  Respiratory: Positive for cough and shortness of breath. Negative for chest tightness and wheezing.   Cardiovascular: Negative for palpitations and leg swelling.  Gastrointestinal: Negative for nausea and vomiting.  Genitourinary: Negative for dysuria.  Musculoskeletal: Negative for joint swelling.  Skin: Negative for rash.  Neurological: Negative for headaches.  Hematological: Does not bruise/bleed easily.  Psychiatric/Behavioral: Negative for dysphoric mood. The patient is not nervous/anxious.        Objective:   Physical Exam Thin male in no acute distress Nose without purulence or discharge noted Chest very decreased breath sounds throughout, no wheezes or rhonchi Cardiac exam with regular rate and rhythm Lower extremities without edema, no cyanosis noted Alert and oriented, moves all 4 extremities.       Assessment & Plan:

## 2010-11-21 NOTE — Assessment & Plan Note (Signed)
The patient is continuing on his bronchodilator regimen, and has quit smoking since last month.  There is nothing to indicate an acute exacerbation.

## 2010-11-21 NOTE — Assessment & Plan Note (Signed)
The patient has bilateral upper lobe pneumonia on his chest x-ray at the last visit, and this is most consistent with bullitis.  Because of poor blood flow to these areas, I would like to treat this similar to a lung abscess.  Will need to extend his antibiotic regimen for a total of 21 days.  He will then followup with a chest x-ray for comparison.  He is clearly better from his last visit with respect to volume of mucus, and discoloration.

## 2010-11-21 NOTE — Patient Instructions (Signed)
Will give you a flu shot today Will extend your antibiotics to 21 days Please come in for a chest xray at the end of your antibiotics, and I will call you with a result Will refer you to pulmonary rehab followup with me in 4mos.

## 2010-12-10 ENCOUNTER — Ambulatory Visit: Payer: Medicare PPO | Admitting: Pulmonary Disease

## 2010-12-13 ENCOUNTER — Other Ambulatory Visit: Payer: Self-pay | Admitting: Emergency Medicine

## 2011-02-28 ENCOUNTER — Other Ambulatory Visit: Payer: Self-pay | Admitting: Pulmonary Disease

## 2011-03-04 ENCOUNTER — Telehealth: Payer: Self-pay | Admitting: Pulmonary Disease

## 2011-03-04 NOTE — Telephone Encounter (Signed)
I spoke with pt and advised him rx was sent on 02/28/11 to [facility]. Pt states he will go p/u the rx then.

## 2011-03-21 ENCOUNTER — Other Ambulatory Visit: Payer: Self-pay | Admitting: Pulmonary Disease

## 2011-04-07 ENCOUNTER — Other Ambulatory Visit: Payer: Self-pay | Admitting: Pulmonary Disease

## 2011-05-03 ENCOUNTER — Other Ambulatory Visit: Payer: Self-pay | Admitting: Pulmonary Disease

## 2011-06-14 ENCOUNTER — Other Ambulatory Visit: Payer: Self-pay | Admitting: Pulmonary Disease

## 2011-06-18 ENCOUNTER — Telehealth: Payer: Self-pay | Admitting: Pulmonary Disease

## 2011-06-18 MED ORDER — TIOTROPIUM BROMIDE MONOHYDRATE 18 MCG IN CAPS: 18.0000 ug | ORAL_CAPSULE | Freq: Every day | RESPIRATORY_TRACT | Status: DC

## 2011-06-18 MED ORDER — ALBUTEROL SULFATE HFA 108 (90 BASE) MCG/ACT IN AERS: 2.0000 | INHALATION_SPRAY | Freq: Four times a day (QID) | RESPIRATORY_TRACT | Status: DC | PRN

## 2011-06-18 MED ORDER — BUDESONIDE-FORMOTEROL FUMARATE 160-4.5 MCG/ACT IN AERO: 2.0000 | INHALATION_SPRAY | Freq: Two times a day (BID) | RESPIRATORY_TRACT | Status: DC

## 2011-06-18 NOTE — Telephone Encounter (Signed)
Pt last seen 11-21-10. Refills sent. Pt is aware.Carron Curie, CMA

## 2011-06-28 ENCOUNTER — Emergency Department (HOSPITAL_COMMUNITY): Payer: Medicare PPO

## 2011-06-28 ENCOUNTER — Inpatient Hospital Stay (HOSPITAL_COMMUNITY)
Admission: EM | Admit: 2011-06-28 | Discharge: 2011-07-01 | DRG: 193 | Disposition: A | Discharge status: Home Health | Payer: Medicare PPO | Attending: Family Medicine | Admitting: Family Medicine

## 2011-06-28 ENCOUNTER — Encounter (HOSPITAL_COMMUNITY): Payer: Self-pay | Admitting: *Deleted

## 2011-06-28 DIAGNOSIS — F411 Generalized anxiety disorder: Secondary | ICD-10-CM | POA: Diagnosis present

## 2011-06-28 DIAGNOSIS — J438 Other emphysema: Secondary | ICD-10-CM

## 2011-06-28 DIAGNOSIS — J189 Pneumonia, unspecified organism: Principal | ICD-10-CM | POA: Diagnosis present

## 2011-06-28 DIAGNOSIS — Z7982 Long term (current) use of aspirin: Secondary | ICD-10-CM

## 2011-06-28 DIAGNOSIS — D72829 Elevated white blood cell count, unspecified: Secondary | ICD-10-CM | POA: Diagnosis present

## 2011-06-28 DIAGNOSIS — F17200 Nicotine dependence, unspecified, uncomplicated: Secondary | ICD-10-CM

## 2011-06-28 DIAGNOSIS — R0602 Shortness of breath: Secondary | ICD-10-CM

## 2011-06-28 DIAGNOSIS — J984 Other disorders of lung: Secondary | ICD-10-CM

## 2011-06-28 DIAGNOSIS — J851 Abscess of lung with pneumonia: Secondary | ICD-10-CM

## 2011-06-28 DIAGNOSIS — J449 Chronic obstructive pulmonary disease, unspecified: Secondary | ICD-10-CM

## 2011-06-28 DIAGNOSIS — J159 Unspecified bacterial pneumonia: Secondary | ICD-10-CM

## 2011-06-28 DIAGNOSIS — F419 Anxiety disorder, unspecified: Secondary | ICD-10-CM | POA: Diagnosis present

## 2011-06-28 DIAGNOSIS — J9600 Acute respiratory failure, unspecified whether with hypoxia or hypercapnia: Secondary | ICD-10-CM | POA: Diagnosis present

## 2011-06-28 DIAGNOSIS — J441 Chronic obstructive pulmonary disease with (acute) exacerbation: Secondary | ICD-10-CM | POA: Diagnosis present

## 2011-06-28 DIAGNOSIS — Z9981 Dependence on supplemental oxygen: Secondary | ICD-10-CM

## 2011-06-28 DIAGNOSIS — R0902 Hypoxemia: Secondary | ICD-10-CM | POA: Diagnosis present

## 2011-06-28 HISTORY — DX: Chronic obstructive pulmonary disease, unspecified: J44.9

## 2011-06-28 LAB — POCT I-STAT, CHEM 8
Calcium, Ion: 1.14 mmol/L (ref 1.12–1.32)
HCT: 45 % (ref 39.0–52.0)
Hemoglobin: 15.3 g/dL (ref 13.0–17.0)
TCO2: 27 mmol/L (ref 0–100)

## 2011-06-28 LAB — DIFFERENTIAL
Basophils Absolute: 0 10*3/uL (ref 0.0–0.1)
Basophils Relative: 0 % (ref 0–1)
Eosinophils Absolute: 0.2 10*3/uL (ref 0.0–0.7)
Eosinophils Relative: 2 % (ref 0–5)
Monocytes Absolute: 0.3 10*3/uL (ref 0.1–1.0)

## 2011-06-28 LAB — CARDIAC PANEL(CRET KIN+CKTOT+MB+TROPI)
Relative Index: 5.2 — ABNORMAL HIGH (ref 0.0–2.5)
Troponin I: 0.34 ng/mL (ref <0.30)

## 2011-06-28 LAB — CBC
HCT: 42.4 % (ref 39.0–52.0)
MCHC: 34.2 g/dL (ref 30.0–36.0)
MCV: 86.2 fL (ref 78.0–100.0)
RDW: 14.4 % (ref 11.5–15.5)

## 2011-06-28 LAB — POCT I-STAT TROPONIN I: Troponin i, poc: 0.05 ng/mL (ref 0.00–0.08)

## 2011-06-28 LAB — STREP PNEUMONIAE URINARY ANTIGEN: Strep Pneumo Urinary Antigen: NEGATIVE

## 2011-06-28 MED ORDER — ALBUTEROL SULFATE (5 MG/ML) 0.5% IN NEBU
2.5000 mg | INHALATION_SOLUTION | RESPIRATORY_TRACT | Status: DC | PRN
  Administered 2011-06-29 – 2011-06-30 (×2): 2.5 mg via RESPIRATORY_TRACT
  Filled 2011-06-28 (×2): qty 0.5

## 2011-06-28 MED ORDER — ALBUTEROL SULFATE (5 MG/ML) 0.5% IN NEBU
5.0000 mg | INHALATION_SOLUTION | Freq: Once | RESPIRATORY_TRACT | Status: AC
  Administered 2011-06-28: 5 mg via RESPIRATORY_TRACT
  Filled 2011-06-28: qty 1

## 2011-06-28 MED ORDER — SODIUM CHLORIDE 0.9 % IJ SOLN
3.0000 mL | Freq: Two times a day (BID) | INTRAMUSCULAR | Status: DC
  Administered 2011-06-28 – 2011-06-30 (×5): 3 mL via INTRAVENOUS

## 2011-06-28 MED ORDER — DEXTROSE 5 % IV SOLN
500.0000 mg | Freq: Once | INTRAVENOUS | Status: AC
  Administered 2011-06-28: 500 mg via INTRAVENOUS
  Filled 2011-06-28: qty 500

## 2011-06-28 MED ORDER — IPRATROPIUM BROMIDE 0.02 % IN SOLN
0.5000 mg | Freq: Once | RESPIRATORY_TRACT | Status: AC
  Administered 2011-06-28: 0.5 mg via RESPIRATORY_TRACT
  Filled 2011-06-28: qty 2.5

## 2011-06-28 MED ORDER — ALBUTEROL SULFATE (5 MG/ML) 0.5% IN NEBU
2.5000 mg | INHALATION_SOLUTION | Freq: Four times a day (QID) | RESPIRATORY_TRACT | Status: DC
  Administered 2011-06-28 – 2011-07-01 (×11): 2.5 mg via RESPIRATORY_TRACT
  Filled 2011-06-28 (×11): qty 0.5

## 2011-06-28 MED ORDER — ALPRAZOLAM 0.25 MG PO TABS
0.2500 mg | ORAL_TABLET | Freq: Every day | ORAL | Status: DC | PRN
  Administered 2011-06-28 – 2011-06-30 (×2): 0.25 mg via ORAL
  Filled 2011-06-28 (×2): qty 1

## 2011-06-28 MED ORDER — SODIUM CHLORIDE 0.9 % IV SOLN: 250.0000 mL | INTRAVENOUS | Status: DC | PRN

## 2011-06-28 MED ORDER — PREDNISONE 50 MG PO TABS
50.0000 mg | ORAL_TABLET | Freq: Every day | ORAL | Status: DC
  Administered 2011-06-29 – 2011-07-01 (×3): 50 mg via ORAL
  Filled 2011-06-28 (×4): qty 1

## 2011-06-28 MED ORDER — SODIUM CHLORIDE 0.9 % IJ SOLN: 3.0000 mL | INTRAMUSCULAR | Status: DC | PRN

## 2011-06-28 MED ORDER — DEXTROSE 5 % IV SOLN
500.0000 mg | INTRAVENOUS | Status: DC
  Administered 2011-06-29 – 2011-07-01 (×3): 500 mg via INTRAVENOUS
  Filled 2011-06-28 (×4): qty 500

## 2011-06-28 MED ORDER — IPRATROPIUM BROMIDE 0.02 % IN SOLN
0.5000 mg | Freq: Four times a day (QID) | RESPIRATORY_TRACT | Status: DC
  Administered 2011-06-28 (×2): 0.5 mg via RESPIRATORY_TRACT
  Filled 2011-06-28 (×3): qty 2.5

## 2011-06-28 MED ORDER — METHYLPREDNISOLONE SODIUM SUCC 125 MG IJ SOLR
125.0000 mg | Freq: Once | INTRAMUSCULAR | Status: DC
  Filled 2011-06-28: qty 2

## 2011-06-28 MED ORDER — ENOXAPARIN SODIUM 40 MG/0.4ML ~~LOC~~ SOLN
40.0000 mg | SUBCUTANEOUS | Status: DC
  Administered 2011-06-28 – 2011-07-01 (×4): 40 mg via SUBCUTANEOUS
  Filled 2011-06-28 (×4): qty 0.4

## 2011-06-28 MED ORDER — ASPIRIN EC 81 MG PO TBEC
81.0000 mg | DELAYED_RELEASE_TABLET | Freq: Every day | ORAL | Status: DC
  Administered 2011-06-29 – 2011-07-01 (×3): 81 mg via ORAL
  Filled 2011-06-28 (×3): qty 1

## 2011-06-28 MED ORDER — ALBUTEROL SULFATE (5 MG/ML) 0.5% IN NEBU
5.0000 mg | INHALATION_SOLUTION | Freq: Four times a day (QID) | RESPIRATORY_TRACT | Status: AC
  Administered 2011-06-28 (×2): 5 mg via RESPIRATORY_TRACT
  Filled 2011-06-28 (×5): qty 0.5

## 2011-06-28 MED ORDER — ADULT MULTIVITAMIN W/MINERALS CH
1.0000 | ORAL_TABLET | Freq: Every day | ORAL | Status: DC
  Administered 2011-06-28 – 2011-07-01 (×4): 1 via ORAL
  Filled 2011-06-28 (×4): qty 1

## 2011-06-28 MED ORDER — IPRATROPIUM BROMIDE 0.02 % IN SOLN
0.5000 mg | Freq: Four times a day (QID) | RESPIRATORY_TRACT | Status: DC
  Administered 2011-06-28 – 2011-07-01 (×11): 0.5 mg via RESPIRATORY_TRACT
  Filled 2011-06-28 (×11): qty 2.5

## 2011-06-28 MED ORDER — BUDESONIDE-FORMOTEROL FUMARATE 160-4.5 MCG/ACT IN AERO
2.0000 | INHALATION_SPRAY | Freq: Two times a day (BID) | RESPIRATORY_TRACT | Status: DC
  Administered 2011-06-28 – 2011-06-30 (×6): 2 via RESPIRATORY_TRACT
  Filled 2011-06-28 (×2): qty 6

## 2011-06-28 MED ORDER — DEXTROSE 5 % IV SOLN
1.0000 g | Freq: Once | INTRAVENOUS | Status: AC
  Administered 2011-06-28: 1 g via INTRAVENOUS
  Filled 2011-06-28: qty 10

## 2011-06-28 MED ORDER — DEXTROSE 5 % IV SOLN
1.0000 g | INTRAVENOUS | Status: DC
  Administered 2011-06-29 – 2011-07-01 (×3): 1 g via INTRAVENOUS
  Filled 2011-06-28 (×5): qty 10

## 2011-06-28 MED ORDER — ASPIRIN EC 325 MG PO TBEC
325.0000 mg | DELAYED_RELEASE_TABLET | Freq: Once | ORAL | Status: AC
  Administered 2011-06-28: 325 mg via ORAL
  Filled 2011-06-28: qty 1

## 2011-06-28 NOTE — Progress Notes (Signed)
[facility] hospitalist progress note. Chief complaint. Mildly elevated troponin. History of present illness. 71 year old male presented to the emergency room and was admitted with shortness of breath. Patient has baseline COPD/emphysema followed by Dr. Shelle Iron of pulmonology. Admitted with probable pneumonia. Patient's first set of cardiac enzymes resulted earlier with CK 159, MB 8.1, troponin less than 0.30. Second to set of cardiac enzymes are now resulted in that have been reported to me. CK 181, MB 9.4, troponin 0.34. I did the see the patient at bedside and a 12-lead EKG was done. This notes normal sinus rhythm without indication of ischemia. Per my interview with the patient has not experienced any chest pain. He has no known coronary artery disease. He is only seen a cardiologist once in consult during prior hospitalization about 14 months ago. Physical exam. Vital signs. Temperature 97.9, pulse 110, respiration 20, blood pressure 1:30/74. O2 sats 95%. General appearance. Somewhat thin elderly male who is alert, cooperative, oriented and in no distress. Cardiac. Rate and rhythm regular. No peripheral edema. Negative Homans. Lungs. Mild expiratory wheezes. Some rhonchi scattered in the upper airways bilaterally. Diminished sounds in the bases. No distress and stable O2 sats. Abdomen. Soft with positive bowel sounds. No pain. Impression/plan. Problem #1 mildly elevated troponin. Given the fact that the patient is chest pain-free, without prior history of coronary artery disease, and has a current normal EKG, I will follow for the next set of cardiac enzymes. If there is any further elevation in troponin I would certainly consider a cardiac consult at that point. I will place the patient on enteric-coated aspirin 81 mg. I will repeat a 12-lead EKG in the a.m. around the time that the next cardiac enzymes result.

## 2011-06-28 NOTE — ED Notes (Signed)
Per EMS: pt started feeling SOB this evening. Pt took home rescue inhaler and he still could not catch his breath. Pt denies CP. Pt was given 2 breathing treatment. 0.5 of atrovent and 5mg  of albuterol for 1 and 5mg  of albuterol for another. Pt was given 125mg  of solumedrol in route as well. Pt hx of COPD pt now stating 91% on RA. Pt alert and oriented talking in full sentences and able to move from bed to stretcher.

## 2011-06-28 NOTE — Progress Notes (Signed)
CRITICAL VALUE ALERT  Critical value received:  CKMB 8.1  Date of notification:  06/28/2011  Time of notification:  1216  Critical value read back:yes  Nurse who received alert:  Gerre Pebbles, RN  MD notified (1st page):  [email or web address] [email or web address]  Time of first page:  1218  MD notified (2nd page):  Time of second page:  Responding MD:  Dr Benjamine Mola  Time MD responded:  1300
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
## 2011-06-28 NOTE — ED Notes (Signed)
Report called to 4700 RN 

## 2011-06-28 NOTE — ED Notes (Addendum)
Walked pt per Dr. Nicanor Alcon O2 sat 94% before pt return to his room sat 91% pt SOB hr 121 informed DR. Palumbo
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
## 2011-06-28 NOTE — Progress Notes (Signed)
CRITICAL VALUE ALERT  Critical value received:  TROPONIN+ 0.34  Date of notification:  06/28/2011  Time of notification:  0705  Critical value read back:yes  Nurse who received alert:  Nyia Tsao  MD notified (1st page):  TOM CALLAHAN  Time of first page:  0707  MD notified (2nd page):  Time of second page:  Responding MD:  Lenny Pastel, PA  Time MD responded:  [id number]

## 2011-06-29 DIAGNOSIS — F17200 Nicotine dependence, unspecified, uncomplicated: Secondary | ICD-10-CM

## 2011-06-29 DIAGNOSIS — R0602 Shortness of breath: Secondary | ICD-10-CM

## 2011-06-29 DIAGNOSIS — J438 Other emphysema: Secondary | ICD-10-CM

## 2011-06-29 DIAGNOSIS — J159 Unspecified bacterial pneumonia: Secondary | ICD-10-CM

## 2011-06-29 LAB — LEGIONELLA ANTIGEN, URINE: Legionella Antigen, Urine: NEGATIVE

## 2011-06-29 LAB — CBC
Hemoglobin: 13 g/dL (ref 13.0–17.0)
MCH: 29.7 pg (ref 26.0–34.0)
RBC: 4.38 MIL/uL (ref 4.22–5.81)

## 2011-06-29 LAB — BASIC METABOLIC PANEL
CO2: 29 mEq/L (ref 19–32)
Chloride: 99 mEq/L (ref 96–112)
GFR calc non Af Amer: 86 mL/min — ABNORMAL LOW (ref >90)
Glucose, Bld: 105 mg/dL — ABNORMAL HIGH (ref 70–99)
Potassium: 4.3 mEq/L (ref 3.5–5.1)
Sodium: 136 mEq/L (ref 135–145)

## 2011-06-29 LAB — CARDIAC PANEL(CRET KIN+CKTOT+MB+TROPI)
CK, MB: 8.9 ng/mL (ref 0.3–4.0)
Total CK: 167 U/L (ref 7–232)
Troponin I: <0.3 ng/mL (ref <0.30)

## 2011-06-29 MED ORDER — BENZONATATE 100 MG PO CAPS
100.0000 mg | ORAL_CAPSULE | Freq: Two times a day (BID) | ORAL | Status: DC
  Administered 2011-06-29 – 2011-07-01 (×5): 100 mg via ORAL
  Filled 2011-06-29 (×7): qty 1

## 2011-06-29 NOTE — Progress Notes (Signed)
Subjective: Patient still having breathing difficulties, better then when he came in No CP No fevers   Objective: Vital signs in last 24 hours: Filed Vitals:   06/29/11 0604 06/29/11 0806 06/29/11 1001 06/29/11 1129  BP: 110/73  116/75   Pulse: 106  102   Temp: 97.3 F (36.3 C)     TempSrc: Oral     Resp: 18  18   Height:      Weight: 57.2 kg (126 lb 1.7 oz)     SpO2: 94% 94% 99% 96%   Weight change:   Intake/Output Summary (Last 24 hours) at 06/29/11 1248 Last data filed at 06/29/11 1217  Gross per 24 hour  Intake    840 ml  Output   2450 ml  Net  -1610 ml    Physical Exam: General: Awake, Oriented, No acute distress. HEENT: EOMI. Neck: Supple CV: S1 and S2 Lungs: wheezes B/L Abdomen: Soft, Nontender, Nondistended, +bowel sounds. Ext: Good pulses. Trace edema.    Lab Results:  Basename 06/29/11 0500 06/28/11 0522  NA 136 138  K 4.3 3.5  CL 99 102  CO2 29 --  GLUCOSE 105* 117*  BUN 12 4*  CREATININE 0.82 0.90  CALCIUM 9.3 --  MG -- --  PHOS -- --   No results found for this basename: AST:2,ALT:2,ALKPHOS:2,BILITOT:2,PROT:2,ALBUMIN:2 in the last 72 hours No results found for this basename: LIPASE:2,AMYLASE:2 in the last 72 hours  Basename 06/29/11 0500 06/28/11 0522 06/28/11 0439  WBC 12.5* -- 12.7*  NEUTROABS -- -- 11.6*  HGB 13.0 15.3 --  HCT 37.7* 45.0 --  MCV 86.1 -- 86.2  PLT 209 -- 196    Basename 06/29/11 0134 06/28/11 1750 06/28/11 1025  CKTOTAL 167 181 159  CKMB 8.9* 9.4* 8.1*  CKMBINDEX -- -- --  TROPONINI <0.30 0.34* <0.30   No components found with this basename: POCBNP:3 No results found for this basename: DDIMER:2 in the last 72 hours No results found for this basename: HGBA1C:2 in the last 72 hours No results found for this basename: CHOL:2,HDL:2,LDLCALC:2,TRIG:2,CHOLHDL:2,LDLDIRECT:2 in the last 72 hours No results found for this basename: TSH,T4TOTAL,FREET3,T3FREE,THYROIDAB in the last 72 hours No results found for this  basename: VITAMINB12:2,FOLATE:2,FERRITIN:2,TIBC:2,IRON:2,RETICCTPCT:2 in the last 72 hours  Micro Results: Recent Results (from the past 240 hour(s))  CULTURE, BLOOD (ROUTINE X 2)     Status: Normal (Preliminary result)   Collection Time   06/28/11 10:25 AM      Component Value Range Status Comment   Specimen Description BLOOD RIGHT ARM   Final    Special Requests BOTTLES DRAWN AEROBIC AND ANAEROBIC 10CC   Final    Culture  Setup Time [id number]   Final    Culture     Final    Value:        BLOOD CULTURE RECEIVED NO GROWTH TO DATE CULTURE WILL BE HELD FOR 5 DAYS BEFORE ISSUING A FINAL NEGATIVE REPORT   Report Status PENDING   Incomplete   CULTURE, BLOOD (ROUTINE X 2)     Status: Normal (Preliminary result)   Collection Time   06/28/11 10:40 AM      Component Value Range Status Comment   Specimen Description BLOOD RIGHT ARM   Final    Special Requests BOTTLES DRAWN AEROBIC ONLY [facility]   Final    Culture  Setup Time [id number]   Final    Culture     Final    Value:        BLOOD CULTURE  RECEIVED NO GROWTH TO DATE CULTURE WILL BE HELD FOR 5 DAYS BEFORE ISSUING A FINAL NEGATIVE REPORT   Report Status PENDING   Incomplete   CULTURE, EXPECTORATED SPUTUM-ASSESSMENT     Status: Normal   Collection Time   06/28/11 11:00 AM      Component Value Range Status Comment   Specimen Description SPUTUM   Final    Special Requests NONE   Final    Sputum evaluation     Final    Value: THIS SPECIMEN IS ACCEPTABLE. RESPIRATORY CULTURE REPORT TO FOLLOW.   Report Status 06/28/2011 FINAL   Final     Studies/Results: Dg Chest 2 View  06/28/2011  *RADIOLOGY REPORT*  Clinical Data: Shortness of breath  CHEST - 2 VIEW  Comparison: 11/11/2010  Findings: Emphysema.  There is right greater than left apical airspace opacities and pleural thickening.  There has been some interval improvement as compared with October 2012 however there are no interval radiographs to document whether this has reaccumulated.  No  pneumothorax.  Mild aortic arch tortuosity. Normal heart size.  No acute osseous finding.  IMPRESSION: Bilateral upper lobe opacities/pleural thickening superimposed on emphysematous changes. Pneumonia not excluded.  Recommend repeat radiograph after therapy to document improvement.  Original Report Authenticated By: Waneta Martins, M.D.    Medications: I have reviewed the patient's current medications. Scheduled Meds:   . albuterol  2.5 mg Nebulization QID  . albuterol  5 mg Nebulization Q6H  . aspirin EC  325 mg Oral Once  . aspirin EC  81 mg Oral Daily  . azithromycin  500 mg Intravenous Q24H  . benzonatate  100 mg Oral BID  . budesonide-formoterol  2 puff Inhalation BID  . cefTRIAXone (ROCEPHIN)  IV  1 g Intravenous Q24H  . enoxaparin  40 mg Subcutaneous Q24H  . ipratropium  0.5 mg Nebulization QID  . mulitivitamin with minerals  1 tablet Oral Daily  . predniSONE  50 mg Oral Q breakfast  . sodium chloride  3 mL Intravenous Q12H  . DISCONTD: ipratropium  0.5 mg Nebulization Q6H   Continuous Infusions:  PRN Meds:.sodium chloride, albuterol, ALPRAZolam, sodium chloride  Assessment/Plan: SOB (shortness of breath)- improved with O2, probably due to PNA, patient is active and has no risk factors for PE   EMPHYSEMA- continue with nebulizers   Anxiety- xanax   Pneumonia- abx, steroids, O2   Leukocytosis- follow to resolution   Tobacco abuse- stopped      LOS: 1 day  Karlina Suares, DO 06/29/2011, 12:48 PM

## 2011-06-29 NOTE — Progress Notes (Signed)
UR Completed. Trenee Igoe, RN, Nurse Case Manager [phone]     

## 2011-06-30 DIAGNOSIS — R0602 Shortness of breath: Secondary | ICD-10-CM

## 2011-06-30 DIAGNOSIS — J438 Other emphysema: Secondary | ICD-10-CM

## 2011-06-30 DIAGNOSIS — J159 Unspecified bacterial pneumonia: Secondary | ICD-10-CM

## 2011-06-30 DIAGNOSIS — F17200 Nicotine dependence, unspecified, uncomplicated: Secondary | ICD-10-CM

## 2011-06-30 LAB — CULTURE, RESPIRATORY W GRAM STAIN

## 2011-06-30 MED ORDER — ALBUTEROL SULFATE HFA 108 (90 BASE) MCG/ACT IN AERS
2.0000 | INHALATION_SPRAY | RESPIRATORY_TRACT | Status: DC | PRN
  Filled 2011-06-30: qty 6.7

## 2011-06-30 NOTE — Progress Notes (Signed)
Pt O2 sats on room air while sitting 93%. Pt O2 sats while ambulating on room air 83%. 2L via nasal cannula placed on patient  O2 sats on 2L while ambulating 93%.

## 2011-06-30 NOTE — Progress Notes (Signed)
Cosign for Chad Haney RN assessment, med admin, I&O, and notes 

## 2011-06-30 NOTE — Clinical Documentation Improvement (Signed)
RESPIRATORY FAILURE DOCUMENTATION CLARIFICATION QUERY   THIS DOCUMENT IS NOT A PERMANENT PART OF THE MEDICAL RECORD   Please update your documentation within the medical record to reflect your response to this query.                                                                                     06/30/11  Dr. Benjamine Mola and/or Associates,  In a better effort to capture your patient's severity of illness, reflect appropriate length of stay and utilization of resources, a review of the patient medical record has revealed the following indicators:  "Respiratory Distress, Tripod position, 02 Sat mid 80's, Diaphoretic, + use of accessory muscles with retractions." [facility] Documentation 06/28/11  Treated with Albuterol and Atrovent Nebs during transport   [facility] Documentation 06/28/11  Based on your clinical judgment, please document in the progress notes and discharge summary if a condition below provides greater specificty regarding his respiratory status on arrival of EMS:  Acute Respiratory Failure, resolved  Acute Respiratory Insufficiency, resolved  Other Condition  Unable to Clinically Determine  In responding to this query please exercise your independent judgment.  The fact that a query is asked, does not imply that any particular answer is desired or expected.       Reviewed: additional documentation in the medical record  Thank You,  Jerral Ralph RN BSN Certified Clinical Documentation Specialist: Cell   [phone]  Health Information Management [place]   TO RESPOND TO THE THIS QUERY, FOLLOW THE INSTRUCTIONS BELOW:  1. If needed, update documentation for the patient's encounter via the notes activity.  2. Access this query again and click edit on the In [employer].  3. After updating, or not, click F2 to complete all highlighted (required) fields concerning your review. Select "additional documentation in the medical record" OR "no additional  documentation provided".  4. Click Sign note button.  5. The deficiency will fall out of your In Basket *Please let us know if you are not able to complete this workflow by phone or e-mail (listed below).

## 2011-06-30 NOTE — Progress Notes (Addendum)
Subjective: Patient still having breathing difficulties, better then when he came in No CP No fevers   Objective: Vital signs in last 24 hours: Filed Vitals:   06/29/11 2229 06/30/11 0520 06/30/11 0611 06/30/11 0855  BP: 129/76 129/75    Pulse: 106 104    Temp: 98 F (36.7 C) 97.9 F (36.6 C)    TempSrc: Oral Oral    Resp: 20 18    Height:      Weight:  57.743 kg (127 lb 4.8 oz)    SpO2: 97% 92% 95% 94%   Weight change: 0.643 kg (1 lb 6.7 oz)  Intake/Output Summary (Last 24 hours) at 06/30/11 1135 Last data filed at 06/30/11 1010  Gross per 24 hour  Intake    705 ml  Output   2325 ml  Net  -1620 ml    Physical Exam: General: Awake, Oriented, No acute distress. HEENT: EOMI. Neck: Supple CV: S1 and S2 Lungs: wheezes B/L Abdomen: Soft, Nontender, Nondistended, +bowel sounds. Ext: Good pulses. Trace edema.    Lab Results:  Basename 06/29/11 0500 06/28/11 0522  NA 136 138  K 4.3 3.5  CL 99 102  CO2 29 --  GLUCOSE 105* 117*  BUN 12 4*  CREATININE 0.82 0.90  CALCIUM 9.3 --  MG -- --  PHOS -- --   No results found for this basename: AST:2,ALT:2,ALKPHOS:2,BILITOT:2,PROT:2,ALBUMIN:2 in the last 72 hours No results found for this basename: LIPASE:2,AMYLASE:2 in the last 72 hours  Basename 06/29/11 0500 06/28/11 0522 06/28/11 0439  WBC 12.5* -- 12.7*  NEUTROABS -- -- 11.6*  HGB 13.0 15.3 --  HCT 37.7* 45.0 --  MCV 86.1 -- 86.2  PLT 209 -- 196    Basename 06/29/11 0134 06/28/11 1750 06/28/11 1025  CKTOTAL 167 181 159  CKMB 8.9* 9.4* 8.1*  CKMBINDEX -- -- --  TROPONINI <0.30 0.34* <0.30   No components found with this basename: POCBNP:3 No results found for this basename: DDIMER:2 in the last 72 hours No results found for this basename: HGBA1C:2 in the last 72 hours No results found for this basename: CHOL:2,HDL:2,LDLCALC:2,TRIG:2,CHOLHDL:2,LDLDIRECT:2 in the last 72 hours No results found for this basename: TSH,T4TOTAL,FREET3,T3FREE,THYROIDAB in the  last 72 hours No results found for this basename: VITAMINB12:2,FOLATE:2,FERRITIN:2,TIBC:2,IRON:2,RETICCTPCT:2 in the last 72 hours  Micro Results: Recent Results (from the past 240 hour(s))  CULTURE, BLOOD (ROUTINE X 2)     Status: Normal (Preliminary result)   Collection Time   06/28/11 10:25 AM      Component Value Range Status Comment   Specimen Description BLOOD RIGHT ARM   Final    Special Requests BOTTLES DRAWN AEROBIC AND ANAEROBIC 10CC   Final    Culture  Setup Time [id number]   Final    Culture     Final    Value:        BLOOD CULTURE RECEIVED NO GROWTH TO DATE CULTURE WILL BE HELD FOR 5 DAYS BEFORE ISSUING A FINAL NEGATIVE REPORT   Report Status PENDING   Incomplete   CULTURE, BLOOD (ROUTINE X 2)     Status: Normal (Preliminary result)   Collection Time   06/28/11 10:40 AM      Component Value Range Status Comment   Specimen Description BLOOD RIGHT ARM   Final    Special Requests BOTTLES DRAWN AEROBIC ONLY [facility]   Final    Culture  Setup Time [id number]   Final    Culture     Final    Value:  BLOOD CULTURE RECEIVED NO GROWTH TO DATE CULTURE WILL BE HELD FOR 5 DAYS BEFORE ISSUING A FINAL NEGATIVE REPORT   Report Status PENDING   Incomplete   CULTURE, EXPECTORATED SPUTUM-ASSESSMENT     Status: Normal   Collection Time   06/28/11 11:00 AM      Component Value Range Status Comment   Specimen Description SPUTUM   Final    Special Requests NONE   Final    Sputum evaluation     Final    Value: THIS SPECIMEN IS ACCEPTABLE. RESPIRATORY CULTURE REPORT TO FOLLOW.   Report Status 06/28/2011 FINAL   Final   CULTURE, RESPIRATORY     Status: Normal   Collection Time   06/28/11 11:00 AM      Component Value Range Status Comment   Specimen Description SPUTUM   Final    Special Requests NONE   Final    Gram Stain     Final    Value: FEW WBC PRESENT,BOTH PMN AND MONONUCLEAR     NO SQUAMOUS EPITHELIAL CELLS SEEN     NO ORGANISMS SEEN   Culture NORMAL OROPHARYNGEAL FLORA   Final     Report Status 06/30/2011 FINAL   Final     Studies/Results: No results found.  Medications: I have reviewed the patient's current medications. Scheduled Meds:    . albuterol  2.5 mg Nebulization QID  . aspirin EC  81 mg Oral Daily  . azithromycin  500 mg Intravenous Q24H  . benzonatate  100 mg Oral BID  . budesonide-formoterol  2 puff Inhalation BID  . cefTRIAXone (ROCEPHIN)  IV  1 g Intravenous Q24H  . enoxaparin  40 mg Subcutaneous Q24H  . ipratropium  0.5 mg Nebulization QID  . mulitivitamin with minerals  1 tablet Oral Daily  . predniSONE  50 mg Oral Q breakfast  . sodium chloride  3 mL Intravenous Q12H   Continuous Infusions:  PRN Meds:.sodium chloride, albuterol, albuterol, ALPRAZolam, sodium chloride  Assessment/Plan: SOB (shortness of breath)- improved with O2, probably due to PNA, patient is active and has no risk factors for PE, never had PFTs but follow with Dr. Shelle Iron- will need outpatient appointment- ?nebulizer machine at home defer to Dr. Shelle Iron  EMPHYSEMA- continue with nebulizers   Acute respiratory failure- not on O2 at home but now requiring  Anxiety- xanax   Pneumonia- abx, steroids, O2   Leukocytosis- follow to resolution- steroids may be elevating  Tobacco abuse- stopped  Hope for D/C in next day or so Check O2 sats on RA rest and ambulating- may need O2      LOS: 2 days  Deeksha Cotrell, DO 06/30/2011, 11:35 AM

## 2011-07-01 DIAGNOSIS — R0602 Shortness of breath: Secondary | ICD-10-CM

## 2011-07-01 DIAGNOSIS — J438 Other emphysema: Secondary | ICD-10-CM

## 2011-07-01 DIAGNOSIS — J441 Chronic obstructive pulmonary disease with (acute) exacerbation: Secondary | ICD-10-CM | POA: Diagnosis present

## 2011-07-01 DIAGNOSIS — R0902 Hypoxemia: Secondary | ICD-10-CM | POA: Diagnosis present

## 2011-07-01 DIAGNOSIS — J159 Unspecified bacterial pneumonia: Secondary | ICD-10-CM

## 2011-07-01 MED ORDER — MOXIFLOXACIN HCL 400 MG PO TABS: 400.0000 mg | ORAL_TABLET | Freq: Every day | ORAL | Status: AC

## 2011-07-01 MED ORDER — ASPIRIN 81 MG PO TBEC: 81.0000 mg | DELAYED_RELEASE_TABLET | Freq: Every day | ORAL | Status: DC

## 2011-07-01 MED ORDER — PREDNISONE 20 MG PO TABS: ORAL_TABLET | ORAL | Status: AC

## 2011-07-01 MED ORDER — IPRATROPIUM-ALBUTEROL 0.5-2.5 (3) MG/3ML IN SOLN: 3.0000 mL | RESPIRATORY_TRACT | Status: DC | PRN

## 2011-07-01 MED ORDER — MOXIFLOXACIN HCL 400 MG PO TABS: 400.0000 mg | ORAL_TABLET | Freq: Every day | ORAL | Status: DC

## 2011-07-01 MED ORDER — ACIDOPHILUS PROBIOTIC 100 MG PO CAPS: 1.0000 | ORAL_CAPSULE | Freq: Three times a day (TID) | ORAL | Status: DC

## 2011-07-01 MED ORDER — BENZONATATE 100 MG PO CAPS: 100.0000 mg | ORAL_CAPSULE | Freq: Two times a day (BID) | ORAL | Status: AC | PRN

## 2011-07-01 NOTE — Discharge Instructions (Signed)
Chronic Obstructive Pulmonary Disease Chronic obstructive pulmonary disease (COPD) is a condition in which airflow from the lungs is restricted. The lungs can never return to normal, but there are measures you can take which will improve them and make you feel better. CAUSES   Smoking.   Exposure to secondhand smoke.   Breathing in irritants (pollution, cigarette smoke, strong smells, aerosol sprays, paint fumes).   History of lung infections.  TREATMENT  Treatment focuses on making you comfortable (supportive care). Your caregiver may prescribe medications (inhaled or pills) to help improve your breathing. HOME CARE INSTRUCTIONS   If you smoke, stop smoking.   Avoid exposure to smoke, chemicals, and fumes that aggravate your breathing.   Take antibiotic medicines as directed by your caregiver.   Avoid medicines that dry up your system and slow down the elimination of secretions (antihistamines and cough syrups). This decreases respiratory capacity and may lead to infections.   Drink enough water and fluids to keep your urine clear or pale yellow. This loosens secretions.   Use humidifiers at home and at your bedside if they do not make breathing difficult.   Receive all protective vaccines your caregiver suggests, especially pneumococcal and influenza.   Use home oxygen as suggested.   Stay active. Exercise and physical activity will help maintain your ability to do things you want to do.   Eat a healthy diet.  SEEK MEDICAL CARE IF:   You develop pus-like mucus (sputum).   Breathing is more labored or exercise becomes difficult to do.   You are running out of the medicine you take for your breathing.  SEEK IMMEDIATE MEDICAL CARE IF:   You have a rapid heart rate.   You have agitation, confusion, tremors, or are in a stupor (family members may need to observe this).   It becomes difficult to breathe.   You develop chest pain.   You have a fever.  MAKE SURE YOU:    Understand these instructions.   Will watch your condition.   Will get help right away if you are not doing well or get worse.  Document Released: 11/05/2004 Document Revised: 01/15/2011 Document Reviewed: 03/28/2010 [facility] Patient Information 2012 [place], [place].Using a Nebulizer If you have asthma or other breathing problems, you might need to breathe in (inhale) medication. This can be done with a nebulizer. A nebulizer is a container that turns liquid medication into a mist that you can inhale. There are different kinds of nebulizers. Most are small. With some, you breathe in through a mouthpiece. With others, a mask fits over your nose and mouth. Most nebulizers must be connected to a small air compressor. Some compressors can run on a battery or can be plugged into an electrical outlet. Air is forced through tubing from the compressor to the nebulizer. The forced air changes the liquid into a fine spray. PREPARATION  Check your medication. Make sure it has not expired and is not damaged in any way.   Wash your hands with soap and water.   Put all the parts of your nebulizer on a sturdy, flat surface. Make sure the tubing connects the compressor and the nebulizer.   Measure the liquid medication according to your caregiver's instructions. Pour it into the nebulizer.   Attach the mouthpiece or mask.   To test the nebulizer, turn it on to make sure a spray is coming out. Then, turn it off.  USING THE NEBULIZER  When using your nebulizer, remember to:  Sit down.   Stay relaxed.   Stop the machine if you start coughing.   Stop the machine if the medication foams or bubbles.   To begin:   If your nebulizer has a mask, put it over your nose and mouth. If you use a mouthpiece, put it in your mouth. Press your lips firmly around the mouthpiece.   Turn on the nebulizer.   Some nebulizers have a finger valve. If yours does, cover up the air hole so the air gets to the  nebulizer.   Breathe out.   Once the medication begins to mist out, take slow, deep breaths. If there is a finger valve, release it at the end of your breath.   Continue taking slow, deep breaths until the nebulizer is empty.  HOME CARE INSTRUCTIONS  The nebulizer and all its parts must be kept very clean. Follow the manufacturer's instructions for cleaning. With most nebulizers, you should:  Wash the nebulizer after each use. Use warm water and soap. Rinse it well. Shake the nebulizer to remove extra water. Put it on a clean towel until itis completely dry. To make sure it is dry, put the nebulizer back together. Turn on the compressor for a few minutes. This will blow air through the nebulizer.   Do not wash the tubing or the finger valve.   Store the nebulizer in a dust-free place.   Inspect the filter every week. Replace it any time it looks dirty.   Sometimes the nebulizer will need a more complete cleaning. The instruction booklet should say how often you need to do this.  POSSIBLE COMPLICATIONS The nebulizer might not produce mist, or foam might come out. Sometimes a filter can get clogged or there might be a problem with the air compressor. Parts are usually made of plastic and will wear out. Over time, you may need to replace some of the parts. Check the instruction booklet that came with your nebulizer. It should tell you how to fix problems or who to call for help. The nebulizer must work properly for it to aid your breathing. Have at least 1 extra nebulizer at home. That way, you will always have one when you need it. SEEK MEDICAL CARE IF:   You continue to have difficulty breathing.   You have trouble using the nebulizer.  Document Released: 01/14/2009 Document Revised: 01/15/2011 Document Reviewed: 01/14/2009 [facility] Patient Information 2012 [place], [place].Cough, Adult  A cough is a reflex. It helps you clear your throat and airways. A cough can help heal your body. A  cough can last 2 or 3 weeks (acute) or may last more than 8 weeks (chronic). Some common causes of a cough can include an infection, allergy, or a cold. HOME CARE  Only take medicine as told by your doctor.   If given, take your medicines (antibiotics) as told. Finish them even if you start to feel better.   Use a cold steam vaporizer or humidier in your home. This can help loosen thick spit (secretions).   Sleep so you are almost sitting up (semi-upright). Use pillows to do this. This helps reduce coughing.   Rest as needed.   Stop smoking if you smoke.  GET HELP RIGHT AWAY IF:  You have yellowish-white fluid (pus) in your thick spit.   Your cough gets worse.   Your medicine does not reduce coughing, and you are losing sleep.   You cough up blood.   You have trouble breathing.  Your pain gets worse and medicine does not help.   You have a fever.  MAKE SURE YOU:   Understand these instructions.   Will watch your condition.   Will get help right away if you are not doing well or get worse.  Document Released: 10/09/2010 Document Revised: 01/15/2011 Document Reviewed: 10/09/2010 [facility] Patient Information 2012 [place], [place].Pneumonia, Adult Pneumonia is an infection of the lungs.  CAUSES Pneumonia may be caused by bacteria or a virus. Usually, these infections are caused by breathing infectious particles into the lungs (respiratory tract). SYMPTOMS   Cough.   Fever.   Chest pain.   Increased rate of breathing.   Wheezing.   Mucus production.  DIAGNOSIS  If you have the common symptoms of pneumonia, your caregiver will typically confirm the diagnosis with a chest X-ray. The X-ray will show an abnormality in the lung (pulmonary infiltrate) if you have pneumonia. Other tests of your blood, urine, or sputum may be done to find the specific cause of your pneumonia. Your caregiver may also do tests (blood gases or pulse oximetry) to see how well your lungs are  working. TREATMENT  Some forms of pneumonia may be spread to other people when you cough or sneeze. You may be asked to wear a mask before and during your exam. Pneumonia that is caused by bacteria is treated with antibiotic medicine. Pneumonia that is caused by the influenza virus may be treated with an antiviral medicine. Most other viral infections must run their course. These infections will not respond to antibiotics.  PREVENTION A pneumococcal shot (vaccine) is available to prevent a common bacterial cause of pneumonia. This is usually suggested for:  People over 49 years old.   Patients on chemotherapy.   People with chronic lung problems, such as bronchitis or emphysema.   People with immune system problems.  If you are over 65 or have a high risk condition, you may receive the pneumococcal vaccine if you have not received it before. In some countries, a routine influenza vaccine is also recommended. This vaccine can help prevent some cases of pneumonia.You may be offered the influenza vaccine as part of your care. If you smoke, it is time to quit. You may receive instructions on how to stop smoking. Your caregiver can provide medicines and counseling to help you quit. HOME CARE INSTRUCTIONS   Cough suppressants may be used if you are losing too much rest. However, coughing protects you by clearing your lungs. You should avoid using cough suppressants if you can.   Your caregiver may have prescribed medicine if he or she thinks your pneumonia is caused by a bacteria or influenza. Finish your medicine even if you start to feel better.   Your caregiver may also prescribe an expectorant. This loosens the mucus to be coughed up.   Only take over-the-counter or prescription medicines for pain, discomfort, or fever as directed by your caregiver.   Do not smoke. Smoking is a common cause of bronchitis and can contribute to pneumonia. If you are a smoker and continue to smoke, your cough  may last several weeks after your pneumonia has cleared.   A cold steam vaporizer or humidifier in your room or home may help loosen mucus.   Coughing is often worse at night. Sleeping in a semi-upright position in a recliner or using a couple pillows under your head will help with this.   Get rest as you feel it is needed. Your body will usually let you  know when you need to rest.  SEEK IMMEDIATE MEDICAL CARE IF:   Your illness becomes worse. This is especially true if you are elderly or weakened from any other disease.   You cannot control your cough with suppressants and are losing sleep.   You begin coughing up blood.   You develop pain which is getting worse or is uncontrolled with medicines.   You have a fever.   Any of the symptoms which initially brought you in for treatment are getting worse rather than better.   You develop shortness of breath or chest pain.  MAKE SURE YOU:   Understand these instructions.   Will watch your condition.   Will get help right away if you are not doing well or get worse.  Document Released: 01/26/2005 Document Revised: 01/15/2011 Document Reviewed: 04/17/2010 [facility] Patient Information 2012 [place], [place].Chronic Obstructive Pulmonary Disease Chronic obstructive pulmonary disease (COPD) is a lung disease. The lungs become damaged, making it hard to get air in and out of your lungs. The damage to your lungs cannot be changed.  HOME CARE  Stop smoking if you smoke. Avoid secondhand smoke.   Only take medicine as told by your doctor.   Talk to your doctor about using cough syrup or over-the-counter medicines.   Drink enough fluids to keep your pee (urine) clear or pale yellow.   Use a humidifier or vaporizer. This may help loosen the thick spit (mucus).   Talk to your doctor about vaccines that help prevent other lung problems (pneumonia and flu vaccines).   Use home oxygen as told by your doctor.   Stay active and exercise.    Eat healthy foods.  GET HELP RIGHT AWAY IF:   Your heart is beating fast.   You become disturbed, confused, shake, or are dazed.   You have trouble breathing.   You have chest pain.   You have a fever.   You cough up thick spit that is yellowish-white or green.   Your breathing becomes worse when you exercise.   You are running out of the medicine you take for your breathing.  MAKE SURE YOU:   Understand these instructions.   Will watch your condition.   Will get help right away if you are not doing well or get worse.  Document Released: 07/15/2007 Document Revised: 01/15/2011 Document Reviewed: 03/28/2010 [facility] Patient Information 2012 [place], [place].  Oxygen Use at Home Oxygen can be prescribed for home use. The prescription will show the flow rate. This is how much oxygen is to be used per minute. This will be listed in liters per minute (LPM or L/M). A liter is a metric measurement of volume. You will use oxygen therapy as directed. It can be used while exercising, sleeping, or at rest. You may need oxygen continuously. Your caregiver may order a blood oxygen test (arterial blood gas or pulse oximetry test) that will show what your oxygen level is. Your caregiver will use these measurements to learn about your needs and follow your progress. Home oxygen therapy is commonly used on patients with various lung (pulmonary) related conditions. Some of these conditions include:  Asthma.   Lung cancer.   Pneumonia.   Emphysema.   Chronic bronchitis.   Cystic fibrosis.   Other lung diseases.   Pulmonary fibrosis.   Occupational lung disease.   Heart failure.   Chronic obstructive pulmonary disease (COPD).  3 COMMON WAYS OF PROVIDING OXYGEN THERAPY  Gas: The gas form of oxygen is  put into variously sized cylinders or tanks. The cylinders or oxygen tanks contain compressed oxygen. The cylinder is equipped with a regulator that controls the flow rate. Because  the flow of oxygen out of the cylinder is constant, an oxygen conserving device may be attached to the system to avoid waste. This device releases the gas only when you inhale and cuts it off when you exhale. Oxygen can be provided in a small cylinder that can be carried with you. Large tanks are heavy and are only for stationary use. After use, empty tanks must be exchanged for full tanks.   Liquid: The liquid form of oxygen is put into a container similar to a thermos. When released, the liquid converts to a gas and you breathe it in just like the compressed gas. This storage method takes up less space than the compressed gas cylinder, and you can transfer the liquid to a small, portable vessel at home. Liquid oxygen is more expensive than the compressed gas, and the vessel vents when not in use. An oxygen conserving device may be built into the vessel to conserve the oxygen. Liquid oxygen is very cold, around 297 below zero.   Oxygen concentrator: This medical device filters oxygen from room air and gives almost 100% oxygen to the patient. Oxygen concentrators are powered by electricity. Benefits of this system are:   It does not need to be resupplied.   It is not as costly as liquid oxygen.   Extra tubing permits the user to move around easier.  There are several types of small, portable oxygen systems available which can help you remain active and mobile. You must have a cylinder of oxygen as a backup in the event of a power failure. Advise your electric power company that you are on oxygen therapy in order to get priority service when there is a power failure. OXYGEN DELIVERY DEVICES There are 3 common ways to deliver oxygen to your body.  Nasal cannula. This is a 2-pronged device inserted in the nostrils that is connected to tubing carrying the oxygen. The tubing can rest on the ears or be attached to the frame of eyeglasses.   Mask. People who need a high flow of oxygen generally use a  mask.   Transtracheal catheter. Transtracheal oxygen therapy requires the insertion of a small, flexible tube (catheter) in the windpipe (trachea). This catheter is held in place by a necklace. Since transtracheal oxygen bypasses the mouth, nose, and throat, a humidifier is absolutely required at flow rates of 1 LPM or greater.  SAFETY ISSUES  Never smoke while using oxygen.   Oxygen does not burn or explode, but materials will burn faster in the presence of oxygen.   Keep a Government social research officer close by. Let your fire department know that you have oxygen in your home.   Warn visitors not to smoke near you when you are using oxygen. Put up "no smoking" signs in your home where you most often use the oxygen.   When you go to a restaurant with your portable oxygen source, ask to be seated in the nonsmoking section.   Stay at least 5 feet away from gas stoves, candles, lighted fireplaces, or other heat sources.   Do not use materials that burn easily (flammable) while using your oxygen.   If you use an oxygen cylinder, make sure it is secured to some fixed object or in a stand. If you use liquid oxygen, make sure the vessel is kept upright  to keep the oxygen from pouring out. Liquid oxygen is so cold it can hurt your skin.   If you use an oxygen concentrator, call your electric company so you will be given priority if there is a power failure. Avoid using extension cords, if possible.  GUIDELINES FOR CLEANING YOUR EQUIPMENT  Wash the nasal prongs with a liquid soap. Thoroughly rinse them once or twice a week.   Replace the prongs every 2 to 4 weeks. If you have an infection (cold, pneumonia) change them when you are well.   Your caregiver will give you instructions on how to clean your transtracheal catheter.   The humidifier bottle should be washed with soap and warm water and rinsed thoroughly between each refill. Air-dry the bottle before filling it with sterile or distilled water. The  bottle and its top should be disinfected after they are cleaned.   If you use an oxygen concentrator, unplug the unit. Then wipe down the cabinet with a damp cloth and dry it daily. The air filter should be cleaned at least twice a week.   Follow your home medical equipment and service company's directions for cleaning the compressor filter.  HOME CARE INSTRUCTIONS   Do not change the flow of oxygen unless directed by your caregiver.   Do not use alcohol or other sedating drugs unless instructed. They slow your breathing rate.   Do not use materials that burn easily (flammable) while using your oxygen.   Always keep a spare tank of oxygen. Plan ahead for holidays when you may not be able to get a prescription filled.   Use water-based lubricants on your lips or nostrils. Do not use an oil-based product like petroleum jelly.   To prevent your cheeks or the skin behind your ears from becoming irritated, tuck some gauze under the tubing.   If you have persistent redness under your nose, call your caregiver.   When you no longer need oxygen, your doctor will have the oxygen discontinued. Oxygen is not addicting or habit-forming.   Use the oxygen as instructed. Too much oxygen can be harmful and too little will not give you the benefit you need.   Shortness of breath is not always from a lack of oxygen. If your oxygen level is not the cause of your shortness of breath, taking oxygen will not help.  SEEK MEDICAL CARE IF:   You have frequent headaches.   You have shortness of breath or a lasting cough.   You have anxiety.   You are confused.   You are drowsy or sleepy all the time.   You develop an illness which aggravates your breathing.   You cannot exercise.   You are restless.   You have blue lips or fingernails.   You have difficult or irregular breathing and it is getting worse.   You have an oral temperature above 102 F (38.9 C).  Document Released: 04/18/2003  Document Revised: 01/15/2011 Document Reviewed: 12/23/2006 [facility] Patient Information 2012 [place], [place].  Return if symptoms recur, worsen or new problems develop.

## 2011-07-01 NOTE — Care Management Note (Signed)
    Page 1 of 1   07/01/2011     1:47:16 PM   CARE MANAGEMENT NOTE 07/01/2011  Patient:  Chad Velazquez, Chad Velazquez   Account Number:  [id number]  Date Initiated:  07/01/2011  Documentation initiated by:  Tera Mater  Subjective/Objective Assessment:   71yo male admitted with COPD Exacerbation.  Pt. lives at home with spouse.     Action/Plan:   Spoke with pt. about home oxygen.  Pt. is willing to use [facility] for oxygen requirements   Anticipated DC Date:  07/01/2011   Anticipated DC Plan:  HOME/SELF CARE      DC Planning Services  CM consult      Choice offered to / List presented to:     DME arranged  NEBULIZER/MEDS  NEBULIZER MACHINE  OXYGEN      DME agency  [facility]        Status of service:  Completed, signed off Medicare Important Message given?  NO (If response is "NO", the following Medicare IM given date fields will be blank) Date Medicare IM given:   Date Additional Medicare IM given:    Discharge Disposition:  HOME/SELF CARE  Per UR Regulation:    If discussed at Long Length of Stay Meetings, dates discussed:    Comments:  07/01/11 TC to [facility] [facility] for DME oxygen, nebulizer machine, and nebulizer medications.  Faxed facesheet, History and physical, DME orders, and oxygen saturation requirements to [phone]).  Pt. to be discharged home with spouse today. Tera Mater, RN, BSN NCM [phone]

## 2011-07-01 NOTE — Discharge Summary (Addendum)
Physician Discharge Summary  Patient ID: Chad Velazquez MRN: [id number] DOB/AGE: 72-May-1942 72 y.o.  Admit date: 06/28/2011 Discharge date: 07/01/2011  Discharge Diagnoses:   COPD with exacerbation  EMPHYSEMA  SOB (shortness of breath)  Anxiety  Pneumonia  Leukocytosis  Hypoxia - home oxygen requiring (pulse ox dropped to 83% with ambulation)  Discharged Condition: good  Hospital Course: From H&P Chad Velazquez is a 71 y.o. white male who presented with SOB. Brought in by EMS after he felt like was suffocating. The current episode started 3 to 5 days ago. The onset was gradual. The problem is severe. The symptoms are relieved by nothing. The symptoms are aggravated by nothing. c/o cough, shortness of breath and wheezing. no chest pain. There were no sick contacts. He follow with Dr. Shelle Iron. Had an episode of bullitis in November- Never followed back up but completed his abx. Not on O2 at home when he was admitted.  He was admitted and treated for acute COPD Exacerbation and pneumonia.  He was treated with nebs, oxygen, IV rocephin and zithromax.  He responded well to the therapy.  He improved.  He was found to desaturate to 83% when ambulating and will be sent home with home oxygen.  He will also be sent home with nebulizers to use as needed for SOB and wheezing.  He is to follow up with his pulmonologist and PCP in the next week.  Pt ruled out for MI by serial cardiac enzymes.  Sputum cultures pending at time of discharge and will need to be followed by PCP and pulmonologist.  Pt improved significantly prior to discharge.  Please see notes, labs, xrays for full details of this admission.      CHEST - 2 VIEW  Comparison: 11/11/2010  Findings: Emphysema. There is right greater than left apical  airspace opacities and pleural thickening. There has been some  interval improvement as compared with October 2012 however there  are no interval radiographs to document whether this has    reaccumulated. No pneumothorax. Mild aortic arch tortuosity.  Normal heart size. No acute osseous finding.  IMPRESSION:  Bilateral upper lobe opacities/pleural thickening superimposed on  emphysematous changes. Pneumonia not excluded. Recommend repeat  radiograph after therapy to document improvement.  Original Report Authenticated By: Waneta Martins, M.D.  Discharge Exam:Pt reports that he is feeling much better, less coughing, no SOB, no fever or chills, coughing up yellow sputum, no chest pain.  Blood pressure 125/82, pulse 96, temperature 98 F (36.7 C), temperature source Oral, resp. rate 18, height 6\' 1"  (1.854 m), weight 58.423 kg (128 lb 12.8 oz), SpO2 96.00%. General - awake, alert, NAD Lungs - BBS no wheezing CV - normal s1, s2 sounds Abd - soft, nondistended, nontender, no masses Ext - no CCE Neuro - nonfocal  Disposition: 01-Home or Self Care with wife  Medication List  As of 07/01/2011 10:55 AM   ASK your doctor about these medications         albuterol 108 (90 BASE) MCG/ACT inhaler   Commonly known as: PROVENTIL HFA;VENTOLIN HFA   Inhale 2 puffs into the lungs every 6 (six) hours as needed for wheezing.      ALPRAZolam 0.25 MG tablet   Commonly known as: XANAX   Take 0.25 mg by mouth daily as needed. For anxiety      budesonide-formoterol 160-4.5 MCG/ACT inhaler   Commonly known as: SYMBICORT   Inhale 2 puffs into the lungs 2 (two) times daily.  ibuprofen 200 MG tablet   Commonly known as: ADVIL,MOTRIN   Take 200 mg by mouth every 6 (six) hours as needed. For pain      mulitivitamin with minerals Tabs   Take 1 tablet by mouth daily.      tiotropium 18 MCG inhalation capsule   Commonly known as: SPIRIVA   Place 1 capsule (18 mcg total) into inhaler and inhale daily.        Avelox 400 mg po daily Probiotic 100 mg cap po TID DUoneb every 4 hours prn SOB and wheezing Prednisone 20 mg, 3 tabs po daily x 5 days, 2 tabs po daily x 5 days, 1 tab po  daily x 5 days, then stop   Follow-up Information    Follow up with LITTLE,JAMES, MD in 5 days.   Contact information:   [address] [place] [address] [phone]       Follow up with Barbaraann Share, MD in 2 weeks.   Contact information:   [address] [facility] [place] [address] [phone]         I spent 38 mins preparing discharge, reviewing notes, records, labs, xrays and preparing discharge summary, counseling with patient and his wife, arranging follow up care and home health needs.   Signed: Aidenn Skellenger 07/01/2011, 10:55 AM

## 2011-07-01 NOTE — Progress Notes (Addendum)
IV d/c'ed. Tele d/c'ed. D/C instructions discussed with pt and pt's wife. All questions answered. Pt states understanding. Pt to be d/c'ed home on 2L O2 via [place]. Pt refused w/c. Pt walked out with staff. Pt d/c'ed to home.

## 2011-07-04 LAB — CULTURE, BLOOD (ROUTINE X 2)
Culture  Setup Time: 201305191708
Culture: NO GROWTH
Culture: NO GROWTH

## 2011-07-10 ENCOUNTER — Telehealth: Payer: Self-pay | Admitting: Pulmonary Disease

## 2011-07-10 NOTE — Telephone Encounter (Signed)
Called and spoke with pt and he was given [facility] recs.  Pt voiced his understanding and nothing further needed.

## 2011-07-10 NOTE — Telephone Encounter (Signed)
Spoke with pt. He states that he was recently discharged from Hospital and finished avelox 2 days ago.  He states that his cough is much better, but he continues to have throat clearing and brings up small amounts of light brown sputum. He thinks that he needs more avelox called in. He states that he does not feel bad by any means and declined ov with TP for this afternoon. Please advise, thanks! No Known Allergies

## 2011-07-10 NOTE — Telephone Encounter (Signed)
Let him know that abx continue to work even after finishing the course.  Would not treat with further abx at this point unless he worsens.

## 2011-07-22 ENCOUNTER — Encounter: Payer: Self-pay | Admitting: Pulmonary Disease

## 2011-07-22 ENCOUNTER — Ambulatory Visit (INDEPENDENT_AMBULATORY_CARE_PROVIDER_SITE_OTHER)
Admission: RE | Admit: 2011-07-22 | Discharge: 2011-07-22 | Disposition: A | Discharge status: Home / Self Care | Payer: Medicare PPO | Source: Ambulatory Visit | Attending: Pulmonary Disease | Admitting: Pulmonary Disease

## 2011-07-22 ENCOUNTER — Ambulatory Visit (INDEPENDENT_AMBULATORY_CARE_PROVIDER_SITE_OTHER): Payer: Medicare PPO | Admitting: Pulmonary Disease

## 2011-07-22 VITALS — BP 118/72 | HR 112 | Temp 98.2°F | Ht 73.0 in | Wt 133.6 lb

## 2011-07-22 DIAGNOSIS — J984 Other disorders of lung: Secondary | ICD-10-CM

## 2011-07-22 DIAGNOSIS — J438 Other emphysema: Secondary | ICD-10-CM

## 2011-07-22 NOTE — Progress Notes (Signed)
  Subjective:    Patient ID: Chad Velazquez, male    DOB: 04-06-1940, 71 y.o.   MRN: [id number]  HPI The pt comes in today for f/u of his known severe copd.  He was recently in hospital with worsening sob (pt describes as "suffocation"), and treated for possible pna.  Although, his cxr on admission was actually improved over his cxr last year.  The pt feels he is much improved since discharge, but has noted that his breathing has "slipped somewhat".  I have explained to him that is probably due to coming off prednisone.  He continues to stay away from cigs.  He has noted a little more cough with tan mucus, and tells me he had a temp of 101 a few days ago.  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  He recently finished a course of avelox.  He currently feels that his breathing is back to his usual baseline.    Review of Systems  Constitutional: Negative.  Negative for fever and unexpected weight change.  HENT: Negative.  Negative for ear pain, nosebleeds, congestion, sore throat, rhinorrhea, sneezing, trouble swallowing, dental problem, postnasal drip and sinus pressure.   Eyes: Negative.  Negative for redness and itching.  Respiratory: Positive for cough, shortness of breath and wheezing. Negative for chest tightness.   Cardiovascular: Negative.  Negative for palpitations and leg swelling.  Gastrointestinal: Negative.  Negative for nausea and vomiting.  Genitourinary: Negative.  Negative for dysuria.  Musculoskeletal: Negative.  Negative for joint swelling.  Skin: Negative.  Negative for rash.  Neurological: Negative.  Negative for headaches.  Hematological: Negative.  Does not bruise/bleed easily.  Psychiatric/Behavioral: Negative.  Negative for dysphoric mood. The patient is not nervous/anxious.        Objective:   Physical Exam Thin male in nad Nose without purulence or discharge noted. Chest with very decreased bs throughout, no wheezing Cor with rrr LE without  edema, no cyanosis. Alert and oriented, moves all 4.        Assessment & Plan:

## 2011-07-22 NOTE — Assessment & Plan Note (Signed)
The patient's chest x-ray at the hospital was actually improved from his prior.  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  The patient understands that it is difficult to totally clear this issue, and that if he continues to have fever over the next one to 2 weeks, he may need antibiotics again.  The patient also needs at some point a followup CT chest, and I will suggest this to him at the next visit.  Will do a chest x-ray today for comparison to his prior.

## 2011-07-22 NOTE — Patient Instructions (Addendum)
No change in current medications You do not require oxygen at rest or with exertion currently Will check a cxr today in light of your low grade temps, and will call you with results.  If there is a change from baseline, will consider antibiotics. followup with me in 3mos, but call if you are having worsening breathing that is worse than the bottom of your "normal range".

## 2011-07-22 NOTE — Assessment & Plan Note (Signed)
The patient has severe COPD on clinical grounds, and I suspect his recent hospitalization was related to an acute exacerbation.  I suspect he did not have pneumonia based on a chest x-ray that was actually improved from his prior film.  I had a very long discussion with the patient about COPD exacerbations, and how they typically result in a decline of lung function that is irreversible.  The goal is to minimize these flareups, and he is asking about chronic prednisone therapy.  I have explained to him we usually reserve this for salvage therapy with end-stage disease.  I did discuss the possibility of daliresp if he continued to have recurrent exacerbations.  For now, he is to continue on his aggressive bronchodilator regimen.  I also stressed to him again the importance of pulmonary rehabilitation, and we'll refer him to the program again.  His oxygen saturations today at rest and with exertion are adequate, and therefore will discontinue his oxygen.  He was not interested in checking overnight oximetry to see if he needed oxygen nocturnally.  After our long discussion, I hope the patient has a better understanding of his disease process and what to expect.  I spent 42 minutes with the patient during this visit.

## 2011-07-23 ENCOUNTER — Telehealth: Payer: Self-pay | Admitting: Pulmonary Disease

## 2011-07-23 NOTE — Telephone Encounter (Signed)
I spoke with patient about results and he verbalized understanding and had no questions regarding results. Pt is wanting an order sent to [facility] to be able to have a spare oxygen tank and regulator on hand for emergencies. He states KC had also mentioned to him about have an ONO done and pt is now wanting to have this done. He is aware KC is out of the office currently and will return tomorrow. Please advise KC thanks

## 2011-07-24 ENCOUNTER — Other Ambulatory Visit: Payer: Self-pay | Admitting: Pulmonary Disease

## 2011-07-24 DIAGNOSIS — J438 Other emphysema: Secondary | ICD-10-CM

## 2011-07-24 NOTE — Telephone Encounter (Signed)
ONO order sent.

## 2011-07-27 NOTE — Telephone Encounter (Signed)
LMTCB

## 2011-07-27 NOTE — Telephone Encounter (Signed)
Patient returning call.

## 2011-07-27 NOTE — Telephone Encounter (Signed)
LMOM TCB x1 to inform pt that ONO order has been sent to [facility] on 6.14.13.

## 2011-07-28 NOTE — Telephone Encounter (Signed)
Spoke with pt and he is aware that the order was sent for ONO.  He is still asking about having a emergency tank to have on hand if needed. Please advise, thanks!

## 2011-07-29 NOTE — Telephone Encounter (Signed)
I spoke with pt and is aware of KC recs. He states [employer] has not called him advising him when the ONO will be done. Since [facility] is closed currently will call in the AM to see when the machine will be brought out to him. Pt wants a call back once we do this

## 2011-07-29 NOTE — Telephone Encounter (Signed)
Are unable to order oxygen for "emergencies".  He either qualifies with his oxygen saturations or he doesn't.   Will call him once I receive his download from ONO.

## 2011-07-30 NOTE — Telephone Encounter (Signed)
PCCs, pt is wanting to know status of ONO order. Please advise thanks

## 2011-07-31 NOTE — Telephone Encounter (Signed)
[facility] called pt and he is scheduled for today Chad Velazquez

## 2011-08-16 ENCOUNTER — Telehealth: Payer: Self-pay | Admitting: Pulmonary Disease

## 2011-08-16 NOTE — Telephone Encounter (Signed)
Lawson Fiscal, I do not see where i spoke with pt about his ONO.l Let him know he drops his oxygen level only to 89%, and only for 16 sec the entire night.  Does not require oxygen.  That is good news.

## 2011-08-20 NOTE — Telephone Encounter (Signed)
LM with Maggie and she will have pt return my call.

## 2011-08-21 NOTE — Telephone Encounter (Signed)
Patient notified of ONO results and verbalized understanding. However, pt is requesting that [facility] write an RX for him to have 1 oxygen tank in his home.for emergencies. He says he has spoke with [place] and he can rent this every month for about $13. He understands that he does not qualify for oxygen but wanted the request made. He says he would just feel better having this in the home for any exacerbation because it takes so long for EMS to get to his home. Pt aware KC is out of the office until 7/18 and is fine with waiting  For response from him. KC, pls advise.

## 2011-08-24 ENCOUNTER — Encounter: Payer: Self-pay | Admitting: Pulmonary Disease

## 2011-08-27 NOTE — Telephone Encounter (Signed)
Please run this by [facility] and see if this is true?  What if pt uses the oxygen, will it be more expensive?

## 2011-09-02 NOTE — Telephone Encounter (Signed)
lmtcb with pt

## 2011-09-03 NOTE — Telephone Encounter (Signed)
Spoke to carol at [facility] she has spoken to pt he wants to get a regulator himself and i need an order for him to have an 02 tank in his home he will pay for it monthly 13-15 dollars a month Tobe Sos

## 2011-09-08 ENCOUNTER — Telehealth: Payer: Self-pay | Admitting: Pulmonary Disease

## 2011-09-08 MED ORDER — LEVOFLOXACIN 750 MG PO TABS: 750.0000 mg | ORAL_TABLET | Freq: Every day | ORAL | Status: AC

## 2011-09-08 MED ORDER — PREDNISONE 10 MG PO TABS: 10.0000 mg | ORAL_TABLET | Freq: Every day | ORAL | Status: DC

## 2011-09-08 NOTE — Telephone Encounter (Signed)
Spoke with the pt and he is c/o having increased SOB, productive cough with tan/brown phelgm, chest tightness, wheezing, fatigue, all x 2 days. Pt sounds SOB over the phone.  The pt was offered an appt this afternoon, but he states he does not feel well enough to come in for OV. I advised since this is the case then he should call EMS and go to ER. Pt states this is fine but he cannot leave his wife. She was recently given a trach and is on the vent and he is her only caregiver and cannot leave her at home. I asked if there was someone else to stay with her and he stated there is no one else. Pt states he has some prednisone at home that he could take but he wanted Dr. Lenard Lance recs first.  Pt is requesting something be called in. Please advise. Carron Curie, CMA No Known Allergies

## 2011-09-08 NOTE — Telephone Encounter (Signed)
Let them know that I will order as a prescription, but will sign NO PAPERWORK since he does not qualify for the oxygen and no 3rd party payors are involved.   See if that is ok with them.

## 2011-09-08 NOTE — Telephone Encounter (Signed)
Ok to call in : levaquin 750mg  one a day for 7 days Prednisone 10mg , 40 qd for 2 days, then 30mg /day for 2 days, then 20mg  a day for 2 days, then stop.

## 2011-09-08 NOTE — Telephone Encounter (Signed)
I spoke with the pt and advised of recs. I advised that he needs to start the medication today. He states he will get someone to get meds for him. Carron Curie, CMA

## 2011-09-10 ENCOUNTER — Other Ambulatory Visit: Payer: Self-pay | Admitting: Pulmonary Disease

## 2011-09-10 DIAGNOSIS — J438 Other emphysema: Secondary | ICD-10-CM

## 2011-09-10 NOTE — Telephone Encounter (Signed)
Yes you can do the rx with liter flow and duration and pt will pay privately for it and you will not have any paperwork Tobe Sos

## 2011-09-30 ENCOUNTER — Telehealth: Payer: Self-pay | Admitting: Pulmonary Disease

## 2011-09-30 NOTE — Telephone Encounter (Signed)
Called spoke with patient who reports that his wife had a laryngectomy yesterday at [facility] and finds it difficult to walk the extensive parking lot/campus with DOE.  Is requesting a handicap placard - will pick this up.  Dr Shelle Iron please advise, thanks.  *pt okayed to LM if he does not answer.

## 2011-09-30 NOTE — Telephone Encounter (Signed)
Ok to have a temporary handicap sticker, no permanent.

## 2011-09-30 NOTE — Telephone Encounter (Signed)
All we have here are the permanent forms, he will need to go to [facility] and pick up temporary form. LMTCB for pt.

## 2011-10-01 NOTE — Telephone Encounter (Signed)
Pt stated he will be bringing the form to drop it off this morning.  Antionette Fairy

## 2011-10-01 NOTE — Telephone Encounter (Signed)
Pt states he will be coming in within the hour to bring this form for Dr. Shelle Iron to sign.  He is requesting this be signed when he comes in as his wife had surgery 2 days ago at [facility].  He is driving to see her and has to walk a distance to the hospital which is difficult for him.  Advised I would send msg to Dr. Shelle Iron and Lawson Fiscal so they are aware.  He verbalized understanding.  Have spoke to [employer] about this.

## 2011-10-02 NOTE — Telephone Encounter (Signed)
Pt brought handicap form by and Kc signed this. A copy was made to be scanned into the pt's chart.

## 2011-10-20 ENCOUNTER — Ambulatory Visit: Payer: Medicare PPO | Admitting: Pulmonary Disease

## 2011-10-28 ENCOUNTER — Telehealth: Payer: Self-pay | Admitting: Pulmonary Disease

## 2011-10-28 MED ORDER — BUDESONIDE-FORMOTEROL FUMARATE 160-4.5 MCG/ACT IN AERO: 2.0000 | INHALATION_SPRAY | Freq: Two times a day (BID) | RESPIRATORY_TRACT | Status: DC

## 2011-10-28 MED ORDER — TIOTROPIUM BROMIDE MONOHYDRATE 18 MCG IN CAPS: 18.0000 ug | ORAL_CAPSULE | Freq: Every day | RESPIRATORY_TRACT | Status: DC

## 2011-10-28 MED ORDER — ALBUTEROL SULFATE HFA 108 (90 BASE) MCG/ACT IN AERS: 2.0000 | INHALATION_SPRAY | Freq: Four times a day (QID) | RESPIRATORY_TRACT | Status: DC | PRN

## 2011-10-28 NOTE — Telephone Encounter (Signed)
RX's have been sent and nothing further was needed

## 2011-11-25 ENCOUNTER — Ambulatory Visit (INDEPENDENT_AMBULATORY_CARE_PROVIDER_SITE_OTHER): Payer: Medicare PPO | Admitting: Pulmonary Disease

## 2011-11-25 ENCOUNTER — Encounter: Payer: Self-pay | Admitting: Pulmonary Disease

## 2011-11-25 VITALS — BP 130/80 | HR 104 | Temp 97.7°F | Ht 73.0 in | Wt 126.2 lb

## 2011-11-25 DIAGNOSIS — Z23 Encounter for immunization: Secondary | ICD-10-CM

## 2011-11-25 DIAGNOSIS — J438 Other emphysema: Secondary | ICD-10-CM

## 2011-11-25 NOTE — Patient Instructions (Addendum)
Stop smoking 100% Work on increasing caloric intake to build muscle mass Work on exercise program to improve quality of life. Will give you the flu shot today. followup with me in 6mos, but call if having increased issues.

## 2011-11-25 NOTE — Progress Notes (Signed)
  Subjective:    Patient ID: Chad Velazquez, male    DOB: 1940-04-23, 71 y.o.   MRN: [id number]  HPI The patient comes in today for followup of his presumed COPD.  He has not wanted to do full pulmonary function studies because of the expense.  He has been maintained on a good bronchodilator regimen, but unfortunately started back smoking.  He is wearing oxygen despite not qualifying for it, and is paying for it out of his own pocket.  He also is requesting chronic prednisone therapy because it "makes him feel better".  The patient is having significant nonpurulent mucus whenever he arises in the mornings, and feels that his dyspnea on exertion is worse than his usual baseline.  He has been taking leftover prednisone from a previous acute exacerbation, and has also been getting prednisone from a friend who is a Publishing rights manager.   Review of Systems  Constitutional: Negative for fever and unexpected weight change.  HENT: Negative for ear pain, nosebleeds, congestion, sore throat, rhinorrhea, sneezing, trouble swallowing, dental problem, postnasal drip and sinus pressure.   Eyes: Negative for redness and itching.  Respiratory: Positive for cough. Negative for chest tightness, shortness of breath and wheezing.   Cardiovascular: Negative for palpitations and leg swelling.  Gastrointestinal: Negative for nausea and vomiting.  Genitourinary: Negative for dysuria.  Musculoskeletal: Negative for joint swelling.  Skin: Negative for rash.  Neurological: Negative for headaches.  Hematological: Does not bruise/bleed easily.  Psychiatric/Behavioral: Positive for dysphoric mood. The patient is not nervous/anxious.        Objective:   Physical Exam Thin male in no acute distress Nose without purulence or discharge noted Oropharynx clear Chest with decreased breath sounds throughout, no wheezing Cardiac exam with regular rate and rhythm Lower extremities without edema, no cyanosis Alert and  oriented, moves all 4 extremities.       Assessment & Plan:

## 2011-11-25 NOTE — Assessment & Plan Note (Addendum)
The patient feels that his dyspnea is worse than his usual baseline, but he is in no distress, and has no bronchospasm on exam.  He is also having a lot of mucus production in the mornings upon arising.  I have explained to him this can be secondary to his returning to smoking, with concomitant airway inflammation.  The patient is demanding chronic prednisone therapy because it makes him feel better, and I have had a long discussion with him about the side effects.  I have told him that it makes no sense to start him on chronic prednisone when he is not following through with my prescribed treatment plan.  This would include total smoking cessation, considering pulmonary rehabilitation, and working on improved calorie intake.  I had a long discussion with him today about these issues, lasting 41 minutes in total.  Also talked with him about doing a followup CT chest, but he is concerned about the expense.

## 2011-12-23 ENCOUNTER — Telehealth: Payer: Self-pay | Admitting: Pulmonary Disease

## 2011-12-23 MED ORDER — NYSTATIN 100000 UNIT/ML MT SUSP: 400000.0000 [IU] | Freq: Three times a day (TID) | OROMUCOSAL | Status: DC

## 2011-12-23 NOTE — Telephone Encounter (Signed)
Called spoke with patient who is c/o thrush > white film on tongue and cheeks, tongue feels raw after waking up in the mornings x3 weeks.  Has been swishing diluted hydrogen peroxide and salt water.  Denies sore throat, ulcers in the mouth.  Last ov w/ KC 10.16.13. [facility] NKDA - verified May leave message on machine if pt's doesn't answer; please send rx.  Dr Shelle Iron please advise, thanks.

## 2011-12-23 NOTE — Telephone Encounter (Signed)
Rx was sent to pharm Pt made aware

## 2011-12-23 NOTE — Telephone Encounter (Signed)
Nystatin suspension 400,000 IU tid swish and swallow for 5 days.  No fills.

## 2012-02-17 ENCOUNTER — Telehealth: Payer: Self-pay | Admitting: Pulmonary Disease

## 2012-02-17 NOTE — Telephone Encounter (Signed)
Pt was last seen on 11/25/11. Last had Nystatin suspension filled on 12/23/11 #138mL  Would this be okay to refill? Please advise. Thanks.

## 2012-02-17 NOTE — Telephone Encounter (Signed)
Ok to fill just this time at 400,000 IU tid for 5 days only He needs to really work on mouth rinsing and spit, then gargle with water and swallow.  See if this helps.

## 2012-02-18 MED ORDER — NYSTATIN 100000 UNIT/ML MT SUSP: 400000.0000 [IU] | Freq: Three times a day (TID) | OROMUCOSAL | Status: DC

## 2012-02-18 NOTE — Telephone Encounter (Signed)
i spoke with pt and is aware. I have sent RX to the pharmacy. Nothing further was needed

## 2012-03-04 ENCOUNTER — Encounter: Payer: Self-pay | Admitting: Emergency Medicine

## 2012-03-04 ENCOUNTER — Telehealth: Payer: Self-pay | Admitting: Pulmonary Disease

## 2012-03-04 ENCOUNTER — Ambulatory Visit (INDEPENDENT_AMBULATORY_CARE_PROVIDER_SITE_OTHER): Payer: Medicare PPO | Admitting: Emergency Medicine

## 2012-03-04 ENCOUNTER — Other Ambulatory Visit (INDEPENDENT_AMBULATORY_CARE_PROVIDER_SITE_OTHER): Payer: Medicare PPO

## 2012-03-04 VITALS — BP 120/80 | HR 121 | Temp 97.5°F | Ht 73.0 in | Wt 122.0 lb

## 2012-03-04 DIAGNOSIS — J438 Other emphysema: Secondary | ICD-10-CM

## 2012-03-04 LAB — CBC
HCT: 42 % (ref 39.0–52.0)
Hemoglobin: 13.9 g/dL (ref 13.0–17.0)
Platelets: 357 10*3/uL (ref 150.0–400.0)
RDW: 15.3 % — ABNORMAL HIGH (ref 11.5–14.6)
WBC: 9.9 10*3/uL (ref 4.5–10.5)

## 2012-03-04 MED ORDER — PREDNISONE 10 MG PO TABS: ORAL_TABLET | ORAL | Status: DC

## 2012-03-04 NOTE — Addendum Note (Signed)
Addended by: Orma Flaming D on: 03/04/2012 04:24 PM   Modules accepted: Orders

## 2012-03-04 NOTE — Assessment & Plan Note (Signed)
-   emphasized the need for smoking cessation.  - explained the risks/benefits of chronic steroid use, dosing on his own - Agreed with him that we would give a steroid taper to zero, then reassess with Dr Shelle Iron whether he should be on pred daily. He believes that he benefited from 5mg  daily.  - he will wear o2 prn, not sure he qualifies.  - check CBC to screen for anemia as a contributor.  - rov Dr Shelle Iron in 2 weeks.

## 2012-03-04 NOTE — Telephone Encounter (Signed)
I spoke with pt and he stated he can do very little activity w/o being SOB, coughing is much more now, and wheezing. He is having to "pant". i scheduled pt to come in and see RB today at 3:30. Will forward to [facility] as an [place]

## 2012-03-04 NOTE — Progress Notes (Signed)
  Subjective:    Patient ID: Chad Velazquez, male    DOB: 01/07/1941, 72 y.o.   MRN: [id number]  HPI 72 yo man, hx presumed COPD (has not wanted PFT). He is on symbicort, spiriva, duonebs prn that he doesn't ever use. He describes severe DOE, worse over the last 2 weeks. He has been taking prednisone (not prescribed by [facility]) prn - tried 5mg  as a maintanance dose since last visit w Dr Shelle Iron, may have helped him. Over the last 10 days he ha been using the pred hit or miss. He states that he quit smoking x 9 days, smoked one last night. He has O2 that he uses that he didn't qualify for.       Objective:   Physical Exam Filed Vitals:   03/04/12 1550  BP: 120/80  Pulse: 121  Temp: 97.5 F (36.4 C)   Thin male in no acute distress Nose without purulence or discharge noted Oropharynx clear, expiratory stridor noted Chest with decreased breath sounds throughout, some referred UA noise Cardiac exam with regular rate and rhythm Lower extremities without edema, no cyanosis Alert and oriented, moves all 4 extremities.     Assessment & Plan:  EMPHYSEMA - emphasized the need for smoking cessation.  - explained the risks/benefits of chronic steroid use, dosing on his own - Agreed with him that we would give a steroid taper to zero, then reassess with Dr Shelle Iron whether he should be on pred daily. He believes that he benefited from 5mg  daily.  - he will wear o2 prn, not sure he qualifies.  - check CBC to screen for anemia as a contributor.  - rov Dr Shelle Iron in 2 weeks.

## 2012-03-04 NOTE — Patient Instructions (Addendum)
You need to stop smoking completely Continue your Symbicort and Spiriva Use Duonebs as needed Take prednisone taper as directed until gone.  Follow with Dr Shelle Iron in 2 weeks to discuss your status and the merits and risks of chronic prednisone use

## 2012-03-08 ENCOUNTER — Telehealth: Payer: Self-pay | Admitting: Pulmonary Disease

## 2012-03-08 NOTE — Telephone Encounter (Signed)
Spoke with patient, patient states he is finding it more difficult to park so far away from entrances to buildings.  Patient requesting handicap placard.  Dr. Shelle Iron is this ok?  Please advise thank you

## 2012-03-08 NOTE — Progress Notes (Signed)
Quick Note:  Spoke with patient informed him of results as listed below per RB. Patient verbalized understanding an nothing further needed at this time. ______

## 2012-03-09 NOTE — Telephone Encounter (Signed)
done

## 2012-03-09 NOTE — Telephone Encounter (Signed)
I am happy to give him a temporary handicap sticker until he can quit smoking, since this is the most significant factor that is resulting in increased sob.  I don't know whether we have the temporary forms here?

## 2012-03-09 NOTE — Telephone Encounter (Signed)
Called spoke with patient, advised of KC's recs as stated below.  Pt verbalized his understanding.   Pt would like this mailed to him. We do have temporary placards in the office, pt's name and the office info filled out.   Placard w/ addressed envelope given to [facility]. Message forwarded to [facility] Pt would like call back once this is completed Thanks

## 2012-03-23 ENCOUNTER — Ambulatory Visit: Payer: Medicare PPO | Admitting: Pulmonary Disease

## 2012-05-25 ENCOUNTER — Ambulatory Visit: Payer: Medicare PPO | Admitting: Pulmonary Disease

## 2012-06-28 ENCOUNTER — Telehealth: Payer: Self-pay | Admitting: Pulmonary Disease

## 2012-06-28 MED ORDER — NYSTATIN 100000 UNIT/ML MT SUSP: 400000.0000 [IU] | Freq: Three times a day (TID) | OROMUCOSAL | Status: DC

## 2012-06-28 NOTE — Telephone Encounter (Signed)
I spoke with pt. He c/o white/erd patches on his tongue, inside of his cheek. He states his mouth hurts and he knows he has thrush. requesting a refill on Nystatin. Per pt he is gargling and rinsing after each use of inhaler. Please advise KC thanks  No Known Allergies

## 2012-06-28 NOTE — Telephone Encounter (Signed)
Rx has been sent in per [facility]. Pt is aware.

## 2012-06-28 NOTE — Telephone Encounter (Signed)
Can have nystatin suspension, 400,000 IU swish and swallow tid for 5 days only.  No fills.

## 2012-07-05 ENCOUNTER — Telehealth: Payer: Self-pay | Admitting: Pulmonary Disease

## 2012-07-05 ENCOUNTER — Other Ambulatory Visit: Payer: Self-pay | Admitting: Pulmonary Disease

## 2012-07-05 NOTE — Telephone Encounter (Signed)
LMTCB  Pt needs ROV with KC. Has not been seen since 11/2011, was told to follow up in 6 months. No pending OV.

## 2012-07-06 NOTE — Telephone Encounter (Signed)
Pt returned call. Chad Velazquez °

## 2012-07-06 NOTE — Telephone Encounter (Signed)
Refills have been sent. Pt needs to set an rov with [facility] first available, has not been seen in a while by [facility].Carron Curie, CMA

## 2012-07-07 NOTE — Telephone Encounter (Signed)
LMTCBx2 to schedule appt with KC. Carron Curie, CMA

## 2012-07-07 NOTE — Telephone Encounter (Signed)
Pt aware refills have been sent will call back to schedule appt says he needs to check his schedule.Raylene Everts

## 2012-08-01 ENCOUNTER — Telehealth: Payer: Self-pay | Admitting: Pulmonary Disease

## 2012-08-01 NOTE — Telephone Encounter (Signed)
Ok to refill his pulmonary meds for next 3 mos only.  I need to see him late summer in the office.

## 2012-08-01 NOTE — Telephone Encounter (Signed)
I spoke with pt. I advised him he has not been seen since 11/2011 and f/u in 6 months. He has not since. Pt stated his wife just had surgery and he has other things going on. He stated it is very difficult for him to get in. He stated he knows he needs to see him at least once a year but it has not been a year yet. He stated he has been doing fine and also he just saw RB in January. Pt is needing refills but does not want to schedule an appt at this time. Please advise KC thanks

## 2012-08-02 MED ORDER — BUDESONIDE-FORMOTEROL FUMARATE 160-4.5 MCG/ACT IN AERO: INHALATION_SPRAY | RESPIRATORY_TRACT | Status: DC

## 2012-08-02 MED ORDER — ALBUTEROL SULFATE HFA 108 (90 BASE) MCG/ACT IN AERS: INHALATION_SPRAY | RESPIRATORY_TRACT | Status: DC

## 2012-08-02 MED ORDER — TIOTROPIUM BROMIDE MONOHYDRATE 18 MCG IN CAPS: 18.0000 ug | ORAL_CAPSULE | Freq: Every day | RESPIRATORY_TRACT | Status: DC

## 2012-08-02 NOTE — Telephone Encounter (Signed)
LMOMTCB - Let pt know that refills were sent to [facility] on [facility] and needs to schedule oc later this summer for further refills

## 2012-08-02 NOTE — Telephone Encounter (Addendum)
Per Angelique Blonder I advised pt that refills had been called in and also scheduled him to f/u w/ Dr. Shelle Iron on 11-01-12 (pt requested this date) for further refills. Nothing further needed per pt. Hazel Sams

## 2012-10-03 ENCOUNTER — Ambulatory Visit (INDEPENDENT_AMBULATORY_CARE_PROVIDER_SITE_OTHER): Payer: Medicare PPO | Admitting: Pulmonary Disease

## 2012-10-03 ENCOUNTER — Telehealth: Payer: Self-pay | Admitting: Pulmonary Disease

## 2012-10-03 ENCOUNTER — Encounter: Payer: Self-pay | Admitting: Pulmonary Disease

## 2012-10-03 VITALS — BP 140/90 | HR 111 | Temp 97.4°F

## 2012-10-03 DIAGNOSIS — J438 Other emphysema: Secondary | ICD-10-CM

## 2012-10-03 DIAGNOSIS — J441 Chronic obstructive pulmonary disease with (acute) exacerbation: Secondary | ICD-10-CM

## 2012-10-03 MED ORDER — METHYLPREDNISOLONE ACETATE 80 MG/ML IJ SUSP
120.0000 mg | Freq: Once | INTRAMUSCULAR | Status: AC
  Administered 2012-10-03: 120 mg via INTRAMUSCULAR

## 2012-10-03 MED ORDER — PREDNISONE 10 MG PO TABS: ORAL_TABLET | ORAL | Status: DC

## 2012-10-03 MED ORDER — LEVOFLOXACIN 750 MG PO TABS: 750.0000 mg | ORAL_TABLET | Freq: Every day | ORAL | Status: DC

## 2012-10-03 NOTE — Assessment & Plan Note (Signed)
The patient is clearly having an ongoing COPD exacerbation, brought on by his continued smoking and airway inflammation.  I suspect it is very likely that he has acute bronchitis as well.  We'll treat him with a course of steroids, as well as an antibiotic.  I would like to see him back in 2 weeks to check on his progress, but he understands to go to the emergency room if he worsens.

## 2012-10-03 NOTE — Patient Instructions (Addendum)
Will give steroid injection to help with your airway inflammation Will treat with prednisone taper over 8 days. levaquin 750mg  one a day for 5 days. You must stop smoking, or this will continue to worsen.  followup with me in 2 weeks, but call if not improving.  Will check oxygen levels at that time.

## 2012-10-03 NOTE — Telephone Encounter (Signed)
I spoke with pt and he is scheduled to come in and see KC at 1:45 this afternoon for acute visit. Nothing further needed

## 2012-10-03 NOTE — Progress Notes (Signed)
  Subjective:    Patient ID: Chad Velazquez, male    DOB: 1941/01/06, 72 y.o.   MRN: [id number]  HPI Patient comes in today for an acute sick visit.  He has presumed COPD, and unfortunately continues to smoke.  He gives a three-week history of worsening chest congestion, cough with difficulty mobilizing mucus, as well as worsening shortness of breath.  His gut and to the point that he is struggling just walking through his house.  He wears oxygen that he pays for own his own and just to help recover after exertional activities.  He has never met requirements for supplemental oxygen.  He denies any fevers, chills, or sweats.   Review of Systems  Constitutional: Negative for fever and unexpected weight change.  HENT: Positive for congestion ( chest congestion). Negative for ear pain, nosebleeds, sore throat, rhinorrhea, sneezing, trouble swallowing, dental problem, postnasal drip and sinus pressure.   Eyes: Negative for redness and itching.  Respiratory: Positive for cough and shortness of breath. Negative for chest tightness and wheezing.   Cardiovascular: Negative for palpitations and leg swelling.  Gastrointestinal: Negative for nausea and vomiting.  Genitourinary: Negative for dysuria.  Musculoskeletal: Negative for joint swelling.  Skin: Negative for rash.  Neurological: Negative for headaches.  Hematological: Does not bruise/bleed easily.  Psychiatric/Behavioral: Negative for dysphoric mood. The patient is not nervous/anxious.        Objective:   Physical Exam Thin and frail appearing male with mild increased work of breathing Nose without purulent discharge noted Neck without lymphadenopathy or thyromegaly Chest with decreased breath sounds, definite expiratory wheezes, no crackles  Cardiac exam was mildly tachycardic but regular rhythm Lower extremities without edema, no cyanosis Alert and oriented, moves all 4 extremities.       Assessment & Plan:

## 2012-10-03 NOTE — Addendum Note (Signed)
Addended by: Maisie Fus on: 10/03/2012 03:14 PM   Modules accepted: Orders

## 2012-10-18 ENCOUNTER — Ambulatory Visit: Payer: Medicare PPO | Admitting: Pulmonary Disease

## 2012-10-28 ENCOUNTER — Encounter: Payer: Self-pay | Admitting: Pulmonary Disease

## 2012-10-28 MED ORDER — ALBUTEROL SULFATE HFA 108 (90 BASE) MCG/ACT IN AERS: INHALATION_SPRAY | RESPIRATORY_TRACT | Status: DC

## 2012-10-28 MED ORDER — TIOTROPIUM BROMIDE MONOHYDRATE 18 MCG IN CAPS: 18.0000 ug | ORAL_CAPSULE | Freq: Every day | RESPIRATORY_TRACT | Status: DC

## 2012-10-28 MED ORDER — BUDESONIDE-FORMOTEROL FUMARATE 160-4.5 MCG/ACT IN AERO: INHALATION_SPRAY | RESPIRATORY_TRACT | Status: DC

## 2012-11-01 ENCOUNTER — Ambulatory Visit: Payer: Medicare PPO | Admitting: Pulmonary Disease

## 2012-12-15 ENCOUNTER — Other Ambulatory Visit: Payer: Self-pay

## 2013-03-01 ENCOUNTER — Ambulatory Visit (INDEPENDENT_AMBULATORY_CARE_PROVIDER_SITE_OTHER): Payer: Medicare PPO | Admitting: Pulmonary Disease

## 2013-03-01 ENCOUNTER — Encounter: Payer: Self-pay | Admitting: Pulmonary Disease

## 2013-03-01 VITALS — BP 138/78 | HR 117 | Temp 97.6°F | Ht 72.0 in | Wt 118.4 lb

## 2013-03-01 DIAGNOSIS — J438 Other emphysema: Secondary | ICD-10-CM

## 2013-03-01 DIAGNOSIS — J441 Chronic obstructive pulmonary disease with (acute) exacerbation: Secondary | ICD-10-CM

## 2013-03-01 MED ORDER — PREDNISONE 10 MG PO TABS: ORAL_TABLET | ORAL | Status: DC

## 2013-03-01 NOTE — Progress Notes (Signed)
   Subjective:    Patient ID: Chad Velazquez, male    DOB: July 31, 1940, 73 y.o.   MRN: [id number]  HPI Patient comes in today for followup of his severe COPD and persistent dyspnea. He has continued to smoke, and has noticed progressive dyspnea on exertion with wheezing over the last few weeks. He denies chest congestion or purulent mucus. He does have some cough and rattling breath sounds. He has no fevers, chills, or sweats.   Review of Systems  Constitutional: Negative for fever and unexpected weight change.  HENT: Negative for congestion, dental problem, ear pain, nosebleeds, postnasal drip, rhinorrhea, sinus pressure, sneezing, sore throat and trouble swallowing.   Eyes: Negative for redness and itching.  Respiratory: Positive for cough and shortness of breath. Negative for chest tightness and wheezing.   Cardiovascular: Negative for palpitations and leg swelling.  Gastrointestinal: Negative for nausea and vomiting.  Genitourinary: Negative for dysuria.  Musculoskeletal: Negative for joint swelling.  Skin: Negative for rash.  Neurological: Negative for headaches.  Hematological: Does not bruise/bleed easily.  Psychiatric/Behavioral: Negative for dysphoric mood. The patient is not nervous/anxious.        Objective:   Physical Exam Cachectic appearing male with mild increased work of breathing. Nose without purulence or discharge noted Oropharynx clear Neck without lymphadenopathy or thyromegaly Chest with very diminished breath sounds, active wheezing bilaterally Cardiac exam with mild tachycardia but regular Lower extremities without edema, no cyanosis Alert and oriented, moves all 4 extremities.       Assessment & Plan:

## 2013-03-01 NOTE — Assessment & Plan Note (Signed)
The patient is clearly having a COPD exacerbation with acute bronchospasm on exam. I have told him this is because he continues to smoke, and it is absolutely imperative that he stop. 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 He is not sure that he is ready for this. We also discussed comfort measures and avoiding mechanical ventilation. He is going to have to think about this. He continues to want oxygen 24 hours a day, but I explained to him that he does not qualify. He is currently using oxygen that he is paying for himself.

## 2013-03-01 NOTE — Patient Instructions (Signed)
Will treat with an 8 day prednisone taper, but stop at 10mg  a day indefiniteley.   Stop smoking!! Think about referral to hospice as we discussed, and also consider limiting heroic care if you become very ill. followup with me in 61mos.

## 2013-06-15 ENCOUNTER — Telehealth: Payer: Self-pay | Admitting: Pulmonary Disease

## 2013-06-15 MED ORDER — PREDNISONE 10 MG PO TABS: ORAL_TABLET | ORAL | Status: DC

## 2013-06-15 MED ORDER — TIOTROPIUM BROMIDE MONOHYDRATE 18 MCG IN CAPS: 18.0000 ug | ORAL_CAPSULE | Freq: Every day | RESPIRATORY_TRACT | Status: DC

## 2013-06-15 MED ORDER — ALBUTEROL SULFATE HFA 108 (90 BASE) MCG/ACT IN AERS: INHALATION_SPRAY | RESPIRATORY_TRACT | Status: DC

## 2013-06-15 MED ORDER — BUDESONIDE-FORMOTEROL FUMARATE 160-4.5 MCG/ACT IN AERO: INHALATION_SPRAY | RESPIRATORY_TRACT | Status: DC

## 2013-06-15 NOTE — Telephone Encounter (Signed)
Spoke with the pt to verify the msg  Rxs were sent to pharm  Nothing further needed per pt

## 2013-06-15 NOTE — Telephone Encounter (Signed)
Pt returning call.Chad Velazquez ° °

## 2013-06-15 NOTE — Telephone Encounter (Signed)
lmomtcb x1 for pt 

## 2013-08-29 ENCOUNTER — Ambulatory Visit: Payer: Medicare PPO | Admitting: Pulmonary Disease

## 2013-10-06 ENCOUNTER — Other Ambulatory Visit: Payer: Self-pay | Admitting: Pulmonary Disease

## 2013-10-06 ENCOUNTER — Telehealth: Payer: Self-pay | Admitting: Pulmonary Disease

## 2013-10-06 MED ORDER — BUDESONIDE-FORMOTEROL FUMARATE 160-4.5 MCG/ACT IN AERO: INHALATION_SPRAY | RESPIRATORY_TRACT | Status: DC

## 2013-10-06 MED ORDER — PREDNISONE 10 MG PO TABS: ORAL_TABLET | ORAL | Status: DC

## 2013-10-06 MED ORDER — TIOTROPIUM BROMIDE MONOHYDRATE 18 MCG IN CAPS: 18.0000 ug | ORAL_CAPSULE | Freq: Every day | RESPIRATORY_TRACT | Status: DC

## 2013-10-06 MED ORDER — ALBUTEROL SULFATE HFA 108 (90 BASE) MCG/ACT IN AERS: INHALATION_SPRAY | RESPIRATORY_TRACT | Status: DC

## 2013-10-06 NOTE — Telephone Encounter (Signed)
Called and spoke with pt and he is aware of refills that have been sent to the pharmacy per pts reqeust. Nothing further is needed.

## 2014-05-22 ENCOUNTER — Telehealth: Payer: Self-pay | Admitting: Pulmonary Disease

## 2014-05-22 MED ORDER — TIOTROPIUM BROMIDE MONOHYDRATE 18 MCG IN CAPS: 18.0000 ug | ORAL_CAPSULE | Freq: Every day | RESPIRATORY_TRACT | Status: DC

## 2014-05-22 MED ORDER — PREDNISONE 10 MG PO TABS: ORAL_TABLET | ORAL | Status: DC

## 2014-05-22 MED ORDER — ALBUTEROL SULFATE HFA 108 (90 BASE) MCG/ACT IN AERS: INHALATION_SPRAY | RESPIRATORY_TRACT | Status: DC

## 2014-05-22 MED ORDER — BUDESONIDE-FORMOTEROL FUMARATE 160-4.5 MCG/ACT IN AERO: INHALATION_SPRAY | RESPIRATORY_TRACT | Status: DC

## 2014-05-22 NOTE — Telephone Encounter (Signed)
Spoke with pt, he is scheduled for 5/9 rov with [facility].  Pt is needing below refills sent.  I advised pt how important it is to keep regular visits to maintain meds.  Refills sent to last pt until visit.  Nothing further needed.

## 2014-05-23 ENCOUNTER — Other Ambulatory Visit: Payer: Self-pay | Admitting: Pulmonary Disease

## 2014-06-18 ENCOUNTER — Ambulatory Visit (INDEPENDENT_AMBULATORY_CARE_PROVIDER_SITE_OTHER): Payer: Medicare PPO | Admitting: Pulmonary Disease

## 2014-06-18 ENCOUNTER — Encounter: Payer: Self-pay | Admitting: Pulmonary Disease

## 2014-06-18 VITALS — BP 114/66 | HR 101 | Temp 97.4°F | Ht 73.5 in | Wt 120.0 lb

## 2014-06-18 DIAGNOSIS — J438 Other emphysema: Secondary | ICD-10-CM | POA: Diagnosis not present

## 2014-06-18 DIAGNOSIS — R911 Solitary pulmonary nodule: Secondary | ICD-10-CM | POA: Diagnosis not present

## 2014-06-18 NOTE — Patient Instructions (Addendum)
Continue on current maintenance medications, as well as prednisone at 5mg  a day.  Work on some type of conditioning program, and increase caloric intake. Work on total smoking cessation followup again in 50mos with Dr. Chase Caller.

## 2014-06-18 NOTE — Assessment & Plan Note (Addendum)
The patient has severe airflow obstruction on his spirometry today, and unfortunately continues to smoke. I have reviewed with him again the gold standard for treatment of COPD.  This includes maintenance bronchodilators which he is on, pulmonary rehabilitation, nutritional therapy, vaccines, oxygen therapy if he qualifies, and finally total smoking cessation. The patient has a history of noncompliance with his office visits and also recommendations, and is unsure if he will ever stop smoking.  He is really on a maximal maintenance bronchodilator regimen, although I would rather see him on inhaled than oral steroids.  I have stressed to him the importance of some type of exercise program, as well as increasing his nutritional support in order to gain weight. We did check ambulatory oximetry today, and he did not desaturate significantly.  Time spent with pt today discussing the above was 61min.

## 2014-06-18 NOTE — Progress Notes (Signed)
   Subjective:    Patient ID: Chad Velazquez, male    DOB: 10/16/1940, 74 y.o.   MRN: [id number]  HPI The patient comes in today for follow-up of his presumed COPD. I have not seen him in quite some time, and although he has stayed on his prescribed medications, he continues to smoke. He has been a very difficult patient to care for because of noncompliance. We have never documented his degree of airflow obstruction, because of his concern over expense. He is willing to have spirometry today. He is staying on nebulized bronchodilators, as well as prednisone at 5 mg a day. He tells me that he has not had an acute exacerbation since his last visit with me. He tells me that his exertional tolerance has probably declined about 50% since his last visit. He is also wearing oxygen that he pays for himself, since we have never documented significant desaturation to meet guidelines. He also has not had a chest x-ray since 2013.   Review of Systems  Constitutional: Negative for fever and unexpected weight change.  HENT: Negative for congestion, dental problem, ear pain, nosebleeds, postnasal drip, rhinorrhea, sinus pressure, sneezing, sore throat and trouble swallowing.   Eyes: Negative for redness and itching.  Respiratory: Positive for cough and shortness of breath. Negative for chest tightness and wheezing.   Cardiovascular: Negative for palpitations and leg swelling.  Gastrointestinal: Negative for nausea and vomiting.  Genitourinary: Negative for dysuria.  Musculoskeletal: Negative for joint swelling.  Skin: Negative for rash.  Neurological: Negative for headaches.  Hematological: Does not bruise/bleed easily.  Psychiatric/Behavioral: Negative for dysphoric mood. The patient is not nervous/anxious.        Objective:   Physical Exam Thin male in no acute distress Nose without purulence or discharge noted Neck without lymphadenopathy or thyromegaly Chest with a few wheezes, and also decreased  breath sounds. Cardiac exam with regular rate and rhythm Lower extremities without edema, no cyanosis Alert and oriented, moves all 4 extremities.       Assessment & Plan:

## 2014-06-18 NOTE — Assessment & Plan Note (Signed)
The patient has apical abnormalities on CT chest in 2012, and these were felt to be bullous changes with scarring but felt to need follow-up. I have not seen patient in quite some time, and I have recommended a follow-up chest x-ray. The patient has declined at this time.

## 2014-06-25 ENCOUNTER — Other Ambulatory Visit: Payer: Self-pay | Admitting: Pulmonary Disease

## 2014-07-19 ENCOUNTER — Other Ambulatory Visit: Payer: Self-pay | Admitting: Pulmonary Disease

## 2014-08-22 ENCOUNTER — Other Ambulatory Visit: Payer: Self-pay | Admitting: *Deleted

## 2014-08-22 MED ORDER — PREDNISONE 10 MG PO TABS: 5.0000 mg | ORAL_TABLET | Freq: Every day | ORAL | Status: DC

## 2014-12-04 ENCOUNTER — Other Ambulatory Visit: Payer: Self-pay | Admitting: *Deleted

## 2014-12-04 MED ORDER — ALBUTEROL SULFATE HFA 108 (90 BASE) MCG/ACT IN AERS: INHALATION_SPRAY | RESPIRATORY_TRACT | Status: DC

## 2014-12-04 MED ORDER — TIOTROPIUM BROMIDE MONOHYDRATE 18 MCG IN CAPS: 18.0000 ug | ORAL_CAPSULE | Freq: Every day | RESPIRATORY_TRACT | Status: DC

## 2015-01-07 ENCOUNTER — Other Ambulatory Visit: Payer: Self-pay | Admitting: Adult Health

## 2015-01-21 ENCOUNTER — Other Ambulatory Visit: Payer: Self-pay | Admitting: Adult Health

## 2015-01-21 ENCOUNTER — Telehealth: Payer: Self-pay | Admitting: Adult Health

## 2015-01-21 MED ORDER — BUDESONIDE-FORMOTEROL FUMARATE 160-4.5 MCG/ACT IN AERO: 2.0000 | INHALATION_SPRAY | Freq: Two times a day (BID) | RESPIRATORY_TRACT | Status: DC

## 2015-01-21 NOTE — Telephone Encounter (Signed)
Spoke with pt. He needs refills sent to [facility] in [place] for Symbicort. [place] was taking care of his needs but has since been switched to [facility]. OV has been scheduled with [facility] for 04/17/15 at 10:30am per the pt's request. Symbicort has been refilled to last until this appointment. Nothing further was needed.

## 2015-01-26 ENCOUNTER — Emergency Department (HOSPITAL_COMMUNITY): Payer: Medicare PPO

## 2015-01-26 ENCOUNTER — Inpatient Hospital Stay (HOSPITAL_COMMUNITY)
Admission: EM | Admit: 2015-01-26 | Discharge: 2015-02-01 | DRG: 871 | Disposition: A | Discharge status: Skilled Nursing Facility | Payer: Medicare PPO | Attending: Internal Medicine | Admitting: Internal Medicine

## 2015-01-26 DIAGNOSIS — I248 Other forms of acute ischemic heart disease: Secondary | ICD-10-CM | POA: Diagnosis present

## 2015-01-26 DIAGNOSIS — J209 Acute bronchitis, unspecified: Secondary | ICD-10-CM | POA: Diagnosis present

## 2015-01-26 DIAGNOSIS — R000 Tachycardia, unspecified: Secondary | ICD-10-CM | POA: Diagnosis not present

## 2015-01-26 DIAGNOSIS — A4101 Sepsis due to Methicillin susceptible Staphylococcus aureus: Secondary | ICD-10-CM | POA: Diagnosis present

## 2015-01-26 DIAGNOSIS — R0602 Shortness of breath: Secondary | ICD-10-CM | POA: Diagnosis not present

## 2015-01-26 DIAGNOSIS — J189 Pneumonia, unspecified organism: Secondary | ICD-10-CM | POA: Diagnosis present

## 2015-01-26 DIAGNOSIS — J440 Chronic obstructive pulmonary disease with acute lower respiratory infection: Secondary | ICD-10-CM | POA: Diagnosis present

## 2015-01-26 DIAGNOSIS — J9622 Acute and chronic respiratory failure with hypercapnia: Secondary | ICD-10-CM | POA: Diagnosis present

## 2015-01-26 DIAGNOSIS — Z515 Encounter for palliative care: Secondary | ICD-10-CM

## 2015-01-26 DIAGNOSIS — Y92009 Unspecified place in unspecified non-institutional (private) residence as the place of occurrence of the external cause: Secondary | ICD-10-CM | POA: Diagnosis not present

## 2015-01-26 DIAGNOSIS — J9602 Acute respiratory failure with hypercapnia: Secondary | ICD-10-CM

## 2015-01-26 DIAGNOSIS — J8410 Pulmonary fibrosis, unspecified: Secondary | ICD-10-CM | POA: Diagnosis present

## 2015-01-26 DIAGNOSIS — R778 Other specified abnormalities of plasma proteins: Secondary | ICD-10-CM

## 2015-01-26 DIAGNOSIS — R579 Shock, unspecified: Secondary | ICD-10-CM | POA: Diagnosis not present

## 2015-01-26 DIAGNOSIS — K219 Gastro-esophageal reflux disease without esophagitis: Secondary | ICD-10-CM | POA: Diagnosis present

## 2015-01-26 DIAGNOSIS — R0600 Dyspnea, unspecified: Secondary | ICD-10-CM | POA: Diagnosis not present

## 2015-01-26 DIAGNOSIS — J441 Chronic obstructive pulmonary disease with (acute) exacerbation: Secondary | ICD-10-CM | POA: Diagnosis not present

## 2015-01-26 DIAGNOSIS — F17210 Nicotine dependence, cigarettes, uncomplicated: Secondary | ICD-10-CM | POA: Diagnosis present

## 2015-01-26 DIAGNOSIS — R7989 Other specified abnormal findings of blood chemistry: Secondary | ICD-10-CM

## 2015-01-26 DIAGNOSIS — R6521 Severe sepsis with septic shock: Secondary | ICD-10-CM | POA: Diagnosis present

## 2015-01-26 DIAGNOSIS — J438 Other emphysema: Secondary | ICD-10-CM

## 2015-01-26 DIAGNOSIS — A4901 Methicillin susceptible Staphylococcus aureus infection, unspecified site: Secondary | ICD-10-CM | POA: Diagnosis not present

## 2015-01-26 DIAGNOSIS — Z681 Body mass index (BMI) 19 or less, adult: Secondary | ICD-10-CM

## 2015-01-26 DIAGNOSIS — Z66 Do not resuscitate: Secondary | ICD-10-CM | POA: Diagnosis not present

## 2015-01-26 DIAGNOSIS — J9621 Acute and chronic respiratory failure with hypoxia: Secondary | ICD-10-CM | POA: Diagnosis present

## 2015-01-26 DIAGNOSIS — Z452 Encounter for adjustment and management of vascular access device: Secondary | ICD-10-CM

## 2015-01-26 DIAGNOSIS — T486X6A Underdosing of antiasthmatics, initial encounter: Secondary | ICD-10-CM | POA: Diagnosis present

## 2015-01-26 DIAGNOSIS — Z91138 Patient's unintentional underdosing of medication regimen for other reason: Secondary | ICD-10-CM

## 2015-01-26 DIAGNOSIS — E43 Unspecified severe protein-calorie malnutrition: Secondary | ICD-10-CM | POA: Diagnosis not present

## 2015-01-26 DIAGNOSIS — J9601 Acute respiratory failure with hypoxia: Secondary | ICD-10-CM | POA: Diagnosis not present

## 2015-01-26 DIAGNOSIS — G4700 Insomnia, unspecified: Secondary | ICD-10-CM | POA: Diagnosis not present

## 2015-01-26 DIAGNOSIS — F419 Anxiety disorder, unspecified: Secondary | ICD-10-CM | POA: Diagnosis present

## 2015-01-26 DIAGNOSIS — R911 Solitary pulmonary nodule: Secondary | ICD-10-CM

## 2015-01-26 DIAGNOSIS — B9561 Methicillin susceptible Staphylococcus aureus infection as the cause of diseases classified elsewhere: Secondary | ICD-10-CM | POA: Diagnosis present

## 2015-01-26 LAB — GLUCOSE, CAPILLARY: Glucose-Capillary: 158 mg/dL — ABNORMAL HIGH (ref 65–99)

## 2015-01-26 LAB — I-STAT ARTERIAL BLOOD GAS, ED
Acid-base deficit: 5 mmol/L — ABNORMAL HIGH (ref 0.0–2.0)
BICARBONATE: 24.1 meq/L — AB (ref 20.0–24.0)
Bicarbonate: 28.8 mEq/L — ABNORMAL HIGH (ref 20.0–24.0)
O2 SAT: 99 %
O2 Saturation: 99 %
PCO2 ART: 63.3 mmHg — AB (ref 35.0–45.0)
PCO2 ART: 58 mmHg — AB (ref 35.0–45.0)
PO2 ART: 169 mmHg — AB (ref 80.0–100.0)
PO2 ART: 139 mmHg — AB (ref 80.0–100.0)
Patient temperature: 98.6
Patient temperature: 98.6
TCO2: 31 mmol/L (ref 0–100)
TCO2: 26 mmol/L (ref 0–100)
pH, Arterial: 7.266 — ABNORMAL LOW (ref 7.350–7.450)
pH, Arterial: 7.226 — ABNORMAL LOW (ref 7.350–7.450)

## 2015-01-26 LAB — BASIC METABOLIC PANEL
ANION GAP: 9 (ref 5–15)
BUN: 8 mg/dL (ref 6–20)
CHLORIDE: 95 mmol/L — AB (ref 101–111)
CO2: 27 mmol/L (ref 22–32)
Calcium: 9.4 mg/dL (ref 8.9–10.3)
Creatinine, Ser: 0.8 mg/dL (ref 0.61–1.24)
GFR calc Af Amer: >60 mL/min (ref >60)
GFR calc non Af Amer: >60 mL/min (ref >60)
GLUCOSE: 164 mg/dL — AB (ref 65–99)
POTASSIUM: 4.3 mmol/L (ref 3.5–5.1)
Sodium: 131 mmol/L — ABNORMAL LOW (ref 135–145)

## 2015-01-26 LAB — APTT: APTT: 31 s (ref 24–37)

## 2015-01-26 LAB — CBC WITH DIFFERENTIAL/PLATELET
Basophils Absolute: 0 10*3/uL (ref 0.0–0.1)
Basophils Relative: 0 %
Eosinophils Absolute: 0.6 10*3/uL (ref 0.0–0.7)
Eosinophils Relative: 4 %
HEMATOCRIT: 49 % (ref 39.0–52.0)
Hemoglobin: 16.6 g/dL (ref 13.0–17.0)
LYMPHS ABS: 2.9 10*3/uL (ref 0.7–4.0)
LYMPHS PCT: 19 %
MCH: 29.3 pg (ref 26.0–34.0)
MCHC: 33.9 g/dL (ref 30.0–36.0)
MCV: 86.4 fL (ref 78.0–100.0)
MONO ABS: 1 10*3/uL (ref 0.1–1.0)
MONOS PCT: 7 %
NEUTROS ABS: 10.9 10*3/uL — AB (ref 1.7–7.7)
Neutrophils Relative %: 70 %
Platelets: 297 10*3/uL (ref 150–400)
RBC: 5.67 MIL/uL (ref 4.22–5.81)
RDW: 14.1 % (ref 11.5–15.5)
WBC: 15.4 10*3/uL — ABNORMAL HIGH (ref 4.0–10.5)

## 2015-01-26 LAB — EXPECTORATED SPUTUM ASSESSMENT W GRAM STAIN, RFLX TO RESP C: Special Requests: NORMAL

## 2015-01-26 LAB — BLOOD GAS, ARTERIAL
Acid-base deficit: 3.7 mmol/L — ABNORMAL HIGH (ref 0.0–2.0)
BICARBONATE: 21.8 meq/L (ref 20.0–24.0)
Drawn by: 23604
FIO2: 0.4
LHR: 18 {breaths}/min
O2 SAT: 98.6 %
PATIENT TEMPERATURE: 98.6
PCO2 ART: 46.3 mmHg — AB (ref 35.0–45.0)
PEEP: 5 cmH2O
PH ART: 7.294 — AB (ref 7.350–7.450)
TCO2: 23.2 mmol/L (ref 0–100)
VT: 550 mL
pO2, Arterial: 135 mmHg — ABNORMAL HIGH (ref 80.0–100.0)

## 2015-01-26 LAB — COMPREHENSIVE METABOLIC PANEL
ALBUMIN: 3.2 g/dL — AB (ref 3.5–5.0)
ALT: 21 U/L (ref 17–63)
ANION GAP: 12 (ref 5–15)
AST: 36 U/L (ref 15–41)
Alkaline Phosphatase: 69 U/L (ref 38–126)
BUN: 8 mg/dL (ref 6–20)
CALCIUM: 8.5 mg/dL — AB (ref 8.9–10.3)
CO2: 25 mmol/L (ref 22–32)
Chloride: 97 mmol/L — ABNORMAL LOW (ref 101–111)
Creatinine, Ser: 1.16 mg/dL (ref 0.61–1.24)
GFR calc non Af Amer: 60 mL/min — ABNORMAL LOW (ref >60)
GLUCOSE: 181 mg/dL — AB (ref 65–99)
POTASSIUM: 3.5 mmol/L (ref 3.5–5.1)
SODIUM: 134 mmol/L — AB (ref 135–145)
Total Bilirubin: 1.2 mg/dL (ref 0.3–1.2)
Total Protein: 6.4 g/dL — ABNORMAL LOW (ref 6.5–8.1)

## 2015-01-26 LAB — URINALYSIS, ROUTINE W REFLEX MICROSCOPIC
BILIRUBIN URINE: NEGATIVE
GLUCOSE, UA: NEGATIVE mg/dL
Ketones, ur: NEGATIVE mg/dL
Leukocytes, UA: NEGATIVE
Nitrite: NEGATIVE
PROTEIN: 100 mg/dL — AB
Specific Gravity, Urine: 1.014 (ref 1.005–1.030)
pH: 5.5 (ref 5.0–8.0)

## 2015-01-26 LAB — PROTIME-INR
INR: 1.14 (ref 0.00–1.49)
Prothrombin Time: 14.8 seconds (ref 11.6–15.2)

## 2015-01-26 LAB — URINE MICROSCOPIC-ADD ON

## 2015-01-26 LAB — TROPONIN I: Troponin I: 0.35 ng/mL — ABNORMAL HIGH (ref <0.031)

## 2015-01-26 LAB — EXPECTORATED SPUTUM ASSESSMENT W REFEX TO RESP CULTURE

## 2015-01-26 LAB — I-STAT TROPONIN, ED: Troponin i, poc: 0.02 ng/mL (ref 0.00–0.08)

## 2015-01-26 LAB — TRIGLYCERIDES: Triglycerides: 72 mg/dL (ref <150)

## 2015-01-26 MED ORDER — FENTANYL CITRATE (PF) 100 MCG/2ML IJ SOLN: 50.0000 ug | INTRAMUSCULAR | Status: DC | PRN

## 2015-01-26 MED ORDER — FENTANYL CITRATE (PF) 100 MCG/2ML IJ SOLN
12.5000 ug | INTRAMUSCULAR | Status: DC | PRN
  Administered 2015-01-27: 12.5 ug via INTRAVENOUS

## 2015-01-26 MED ORDER — DOCUSATE SODIUM 100 MG PO CAPS: 100.0000 mg | ORAL_CAPSULE | Freq: Two times a day (BID) | ORAL | Status: DC | PRN

## 2015-01-26 MED ORDER — DEXTROSE 5 % IV SOLN
500.0000 mg | INTRAVENOUS | Status: DC
  Administered 2015-01-27 – 2015-01-29 (×4): 500 mg via INTRAVENOUS
  Filled 2015-01-26 (×5): qty 500

## 2015-01-26 MED ORDER — SODIUM CHLORIDE 0.9 % IV SOLN
INTRAVENOUS | Status: DC
  Administered 2015-01-26: 19:00:00 via INTRAVENOUS

## 2015-01-26 MED ORDER — MIDAZOLAM HCL 2 MG/2ML IJ SOLN
1.0000 mg | INTRAMUSCULAR | Status: AC | PRN
  Administered 2015-01-26 (×3): 1 mg via INTRAVENOUS
  Filled 2015-01-26 (×2): qty 2

## 2015-01-26 MED ORDER — PROPOFOL 1000 MG/100ML IV EMUL
0.0000 ug/kg/min | INTRAVENOUS | Status: DC
  Administered 2015-01-26: 15 ug/kg/min via INTRAVENOUS
  Filled 2015-01-26: qty 100

## 2015-01-26 MED ORDER — ETOMIDATE 2 MG/ML IV SOLN
INTRAVENOUS | Status: AC | PRN
  Administered 2015-01-26: 20 mg via INTRAVENOUS

## 2015-01-26 MED ORDER — ASPIRIN 81 MG PO CHEW
324.0000 mg | CHEWABLE_TABLET | Freq: Once | ORAL | Status: AC
  Administered 2015-01-26: 324 mg via ORAL
  Filled 2015-01-26: qty 4

## 2015-01-26 MED ORDER — SODIUM CHLORIDE 0.9 % IV SOLN
10.0000 ug/h | INTRAVENOUS | Status: DC
  Administered 2015-01-26: 25 ug/h via INTRAVENOUS
  Filled 2015-01-26 (×2): qty 50

## 2015-01-26 MED ORDER — FAMOTIDINE 40 MG/5ML PO SUSR
20.0000 mg | Freq: Two times a day (BID) | ORAL | Status: DC
  Administered 2015-01-26 – 2015-01-27 (×2): 20 mg
  Filled 2015-01-26 (×3): qty 2.5

## 2015-01-26 MED ORDER — DOCUSATE SODIUM 50 MG/5ML PO LIQD: 100.0000 mg | Freq: Two times a day (BID) | ORAL | Status: DC | PRN

## 2015-01-26 MED ORDER — HEPARIN (PORCINE) IN NACL 100-0.45 UNIT/ML-% IJ SOLN
1100.0000 [IU]/h | INTRAMUSCULAR | Status: DC
  Administered 2015-01-26: 650 [IU]/h via INTRAVENOUS
  Administered 2015-01-28: 1100 [IU]/h via INTRAVENOUS
  Filled 2015-01-26 (×2): qty 250

## 2015-01-26 MED ORDER — HEPARIN BOLUS VIA INFUSION
3100.0000 [IU] | Freq: Once | INTRAVENOUS | Status: AC
  Administered 2015-01-26: 3100 [IU] via INTRAVENOUS
  Filled 2015-01-26: qty 3100

## 2015-01-26 MED ORDER — SODIUM CHLORIDE 0.9 % IV BOLUS (SEPSIS)
1000.0000 mL | Freq: Once | INTRAVENOUS | Status: AC
  Administered 2015-01-26: 1000 mL via INTRAVENOUS

## 2015-01-26 MED ORDER — FENTANYL CITRATE (PF) 100 MCG/2ML IJ SOLN
100.0000 ug | Freq: Once | INTRAMUSCULAR | Status: AC
  Administered 2015-01-26: 100 ug via INTRAVENOUS
  Filled 2015-01-26: qty 2

## 2015-01-26 MED ORDER — MIDAZOLAM HCL 2 MG/2ML IJ SOLN
1.0000 mg | INTRAMUSCULAR | Status: AC | PRN
  Administered 2015-01-26 (×3): 1 mg via INTRAVENOUS
  Filled 2015-01-26 (×3): qty 2

## 2015-01-26 MED ORDER — METHYLPREDNISOLONE SODIUM SUCC 40 MG IJ SOLR
40.0000 mg | INTRAMUSCULAR | Status: DC
  Filled 2015-01-26: qty 1

## 2015-01-26 MED ORDER — ASPIRIN 81 MG PO CHEW
81.0000 mg | CHEWABLE_TABLET | Freq: Every day | ORAL | Status: DC
  Administered 2015-01-27 – 2015-02-01 (×5): 81 mg via ORAL
  Filled 2015-01-26 (×6): qty 1

## 2015-01-26 MED ORDER — MIDAZOLAM HCL 2 MG/2ML IJ SOLN: 1.0000 mg | INTRAMUSCULAR | Status: DC | PRN

## 2015-01-26 MED ORDER — FENTANYL CITRATE (PF) 100 MCG/2ML IJ SOLN: 25.0000 ug | INTRAMUSCULAR | Status: DC | PRN

## 2015-01-26 MED ORDER — MIDAZOLAM HCL 2 MG/2ML IJ SOLN
1.0000 mg | INTRAMUSCULAR | Status: DC | PRN
  Administered 2015-01-27: 1 mg via INTRAVENOUS
  Administered 2015-01-27: 2 mg via INTRAVENOUS
  Administered 2015-01-27 (×2): 1 mg via INTRAVENOUS
  Filled 2015-01-26 (×5): qty 2

## 2015-01-26 MED ORDER — SUCCINYLCHOLINE CHLORIDE 20 MG/ML IJ SOLN
INTRAMUSCULAR | Status: AC | PRN
  Administered 2015-01-26: 60 mg via INTRAVENOUS

## 2015-01-26 MED ORDER — ENOXAPARIN SODIUM 40 MG/0.4ML ~~LOC~~ SOLN: 40.0000 mg | SUBCUTANEOUS | Status: DC

## 2015-01-26 MED ORDER — DEXTROSE 5 % IV SOLN
1.0000 g | INTRAVENOUS | Status: DC
  Administered 2015-01-26 – 2015-01-29 (×4): 1 g via INTRAVENOUS
  Filled 2015-01-26 (×7): qty 10

## 2015-01-26 MED ORDER — HEPARIN SODIUM (PORCINE) 5000 UNIT/ML IJ SOLN: 60.0000 [IU]/kg | INTRAMUSCULAR | Status: DC

## 2015-01-26 MED ORDER — IPRATROPIUM-ALBUTEROL 0.5-2.5 (3) MG/3ML IN SOLN
3.0000 mL | RESPIRATORY_TRACT | Status: DC
Start: 2015-01-26 — End: 2015-01-27
  Administered 2015-01-26 – 2015-01-27 (×4): 3 mL via RESPIRATORY_TRACT
  Filled 2015-01-26 (×3): qty 3

## 2015-01-26 MED ORDER — IPRATROPIUM-ALBUTEROL 0.5-2.5 (3) MG/3ML IN SOLN
3.0000 mL | RESPIRATORY_TRACT | Status: AC
  Administered 2015-01-26: 3 mL via RESPIRATORY_TRACT
  Filled 2015-01-26: qty 3

## 2015-01-26 MED ORDER — SODIUM CHLORIDE 0.9 % IV SOLN
250.0000 mL | INTRAVENOUS | Status: DC | PRN
  Administered 2015-01-27: 1 mL via INTRAVENOUS

## 2015-01-26 NOTE — ED Notes (Signed)
Cardiology MD at bedside.

## 2015-01-26 NOTE — ED Notes (Signed)
Attempted to call family regarding update on plan of care.

## 2015-01-26 NOTE — ED Notes (Signed)
MD aware of hypotension and bolus 500 ml ordered

## 2015-01-26 NOTE — ED Notes (Signed)
Pt pulling at tube and anxious on assessment, given versed and placed mittens on pt. icu md at bedside for eval.

## 2015-01-26 NOTE — Progress Notes (Signed)
[facility] Progress Note Patient Name: Chad Velazquez DOB: Apr 07, 1940 MRN: [id number]   Date of Service  01/26/2015  HPI/Events of Note  Bedside nurse request for Foley catheter and bilateral wrist restraints.   eICU Interventions  Will order: 1. Foley Catheter. 2. Bilateral wrist restraints.      Intervention Category Minor Interventions: Routine modifications to care plan (e.g. PRN medications for pain, fever)  Sommer,Steven Eugene 01/26/2015, 9:53 PM

## 2015-01-26 NOTE — Progress Notes (Signed)
Patient transported to 2S-07 on vent from ED with no complications.

## 2015-01-26 NOTE — ED Notes (Signed)
When repositioning pt in bed, pt became arousable and combatitve versed given as ordered.

## 2015-01-26 NOTE — ED Notes (Signed)
Received pt from home with c/o respiratory distress. Pt c/o COPD exacerbation, chest pain with cough x 4 days. Pt given 2 gm mag sulfate, 125 solumedrol, 2 duo neb by EMS. EMS attempted CPAP without relief.

## 2015-01-26 NOTE — ED Notes (Signed)
Attempted report 
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
## 2015-01-26 NOTE — Progress Notes (Addendum)
ANTICOAGULATION CONSULT NOTE - Initial Consult  Pharmacy Consult for heparin  Indication: chest pain/ACS  No Known Allergies  Patient Measurements: Height: 6\' 2"  (188 cm) Weight: 115 lb (52.164 kg) IBW/kg (Calculated) : 82.2 Heparin Dosing Weight:   Vital Signs: BP: 80/54 mmHg (12/17 2030) Pulse Rate: 121 (12/17 2030)  Labs:  Recent Labs  01/26/15 1701  HGB 16.6  HCT 49.0  PLT 297  APTT 31  LABPROT 14.8  INR 1.14  CREATININE 0.80    Estimated Creatinine Clearance: 58.9 mL/min (by C-G formula based on Cr of 0.8).   Medical History: Past Medical History  Diagnosis Date  . Emphysema   . Abnormal chest CT   . Pneumonia   . COPD (chronic obstructive pulmonary disease)     Medications:  See electronic list  Assessment: 74 y/o male with COPD who presents to the ED with respiratory distress, chest pain, and cough for 4 days. He was intubated. ECG with concerns for STEMI, Code STEMI called and pharmacy consulted to begin IV heparin. No bleeding noted, CBC is normal.  Goal of Therapy:  Heparin level 0.3-0.7 units/ml Monitor platelets by anticoagulation protocol: Yes   Plan:  - Heparin 3100 units IV bolus then 650 units/hr - 6 hr HL - Daily HL and CBC - Monitor for s/sx of bleeding  [facility], Pharm.D., BCPS Clinical Pharmacist Pager: [phone] 01/26/2015 8:46 PM   Addendum: Also consulted for ceftriaxone and azithromycin dosing for CAP/COPD exacerbation.  Ceftrixone 1 g IV q24h Azithromycin 500 mg IV q24h  [facility], Pharm.D., BCPS Clinical Pharmacist Pager: [phone] 01/26/2015 9:12 PM

## 2015-01-26 NOTE — Progress Notes (Signed)
[place] Progress Note Patient Name: Chad Velazquez DOB: 1940/11/29 MRN: [id number]   Date of Service  01/26/2015  HPI/Events of Note  Hypotension - s/p Fentanyl 50 mcg IV bolus.   eICU Interventions  Will order: 1. Decrease Fentanyl IV bolus to 25 mcg. 2. 0.9 NaCl 1 liter IV over 1 hour now.      Intervention Category Major Interventions: Hypotension - evaluation and management  Sommer,Steven Eugene 01/26/2015, 10:53 PM

## 2015-01-26 NOTE — ED Notes (Signed)
Called lab to add Troponin to labs drawn at  2019.
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
## 2015-01-26 NOTE — Code Documentation (Signed)
Time out completed by staff at the bedside

## 2015-01-26 NOTE — ED Notes (Signed)
Pt requesting to be intubated, Dr Reather Converse made aware.

## 2015-01-26 NOTE — Progress Notes (Signed)
Patient intubated by MD for respiratory distress, good color change on ETCO2 detector, equal BBS, placed on above vent settings MD aware.

## 2015-01-26 NOTE — H&P (Signed)
PULMONARY / CRITICAL CARE MEDICINE History and Physical  Name: Chad Velazquez MRN: [id number] DOB: 05-26-1940    ADMISSION DATE:  01/26/2015 CONSULTATION DATE:  01/26/15  REFERRING MD:  Dr Reather Converse, MD  CHIEF COMPLAINT:  Respiratory failure  HISTORY OF PRESENT ILLNESS:   Chad Velazquez is a 74 y.o. male with advanced COPD who presents with shortness of breath. Per the ED staff, the patient reported 4 days of worsening shortness of breath after running out of his inhaler therapies for COPD. We are uncertain if he had fevers, chills, URI symptoms or changes in his sputum production. He received Methylprednisolone en route by EMS. In the ED, he was found to be hypoxic and hypercarbic. After a trial of BiPAP, the patient requested intubation due to continued dyspnea. He was transiently hypotensive after intubation and sedation but responded to fluid boluses. A CXR was done and revealed hyperinflated lungs, chronic scarring but no focal consolidations which were new. He reportedly had chest pain as well with EKG having concerns for ST segment elevation in V3-5. Cardiology was called but believe this is secondary to stress from his respiratory status. A POC troponin was negative in the ED.   PAST MEDICAL HISTORY :  He  has a past medical history of Emphysema; Abnormal chest CT; Pneumonia; and COPD (chronic obstructive pulmonary disease).  PAST SURGICAL HISTORY: He  has past surgical history that includes laparotomy.  No Known Allergies  No current facility-administered medications on file prior to encounter.   Current Outpatient Prescriptions on File Prior to Encounter  Medication Sig  . ALPRAZolam (XANAX) 0.5 MG tablet Take 0.5 mg by mouth 3 (three) times daily.  . B Complex Vitamins (VITAMIN-B COMPLEX) TABS Take by mouth.  . budesonide-formoterol (SYMBICORT) 160-4.5 MCG/ACT inhaler Inhale 2 puffs into the lungs 2 (two) times daily.  Marland Kitchen ibuprofen (ADVIL,MOTRIN) 200 MG tablet Take 200 mg by  mouth every 6 (six) hours as needed. For pain  . ipratropium-albuterol (DUONEB) 0.5-2.5 (3) MG/3ML SOLN Take 3 mLs by nebulization every 4 (four) hours as needed (SOB, WHEEZING, COUGHING).  . Multiple Vitamin (MULITIVITAMIN WITH MINERALS) TABS Take 1 tablet by mouth daily.  . predniSONE (DELTASONE) 10 MG tablet Take 0.5 tablets (5 mg total) by mouth daily.  Marland Kitchen SPIRIVA HANDIHALER 18 MCG inhalation capsule INHALE CONTENTS OF 1 CAPSULE BY MOUTH ONCE DAILY VIA HANDIHALER  . VENTOLIN HFA 108 (90 BASE) MCG/ACT inhaler INHALE TWO PUFFS BY MOUTH EVERY 6 HOURS AS NEEDED FOR WHEEZING    FAMILY HISTORY:  His is adopted.  SOCIAL HISTORY: He  reports that he has been smoking Cigarettes.  He has a 59 pack-year smoking history. He has never used smokeless tobacco. He reports that he does not drink alcohol or use illicit drugs.  REVIEW OF SYSTEMS:   Full ROS unable to be obtained aside from HPI as patient is sedated and intubated  VITAL SIGNS: BP 85/64 mmHg  Pulse 29  Temp(Src) 96.8 F (36 C) (Axillary)  Resp 18  Ht 5\' 8"  (1.727 m)  Wt 52.164 kg (115 lb)  BMI 17.49 kg/m2  SpO2 97%  VENTILATOR SETTINGS: Vent Mode:  [-] PRVC FiO2 (%):  [40 %] 40 % Set Rate:  [16 bmp-18 bmp] 18 bmp Vt Set:  [550 mL-600 mL] 550 mL PEEP:  [5 cmH20] 5 cmH20 Plateau Pressure:  [18 cmH20-19 cmH20] 18 cmH20  INTAKE / OUTPUT: I/O last 3 completed shifts: In: 1000 [I.V.:1000] Out: -   PHYSICAL EXAMINATION: General:  Sedate, no  acute distress, on mechanical ventilator Neuro:  Occasionally moves all extremities and sits up when sedation wanes HEENT:  Moist membranes, ETT in place, normal sclera Cardiovascular:  Tachycardic, regular rhythm, II/VI systolic murmur over LSB Lungs:  CTAB except for occasional expiratory wheezing, overall poor air movement Abdomen:  Soft, nontender, nondistended Musculoskeletal:  No focal findings Skin:  No major findings  LABS:  BMET  Recent Labs Lab 01/26/15 1701  01/26/15 2019  NA 131* 134*  K 4.3 3.5  CL 95* 97*  CO2 27 25  BUN 8 8  CREATININE 0.80 1.16  GLUCOSE 164* 181*    Electrolytes  Recent Labs Lab 01/26/15 1701 01/26/15 2019  CALCIUM 9.4 8.5*    CBC  Recent Labs Lab 01/26/15 1701  WBC 15.4*  HGB 16.6  HCT 49.0  PLT 297    Coag's  Recent Labs Lab 01/26/15 1701  APTT 31  INR 1.14    Sepsis Markers No results for input(s): LATICACIDVEN, PROCALCITON, O2SATVEN in the last 168 hours.  ABG  Recent Labs Lab 01/26/15 1739 01/26/15 1931 01/26/15 2305  PHART 7.266* 7.226* 7.294*  PCO2ART 63.3* 58.0* 46.3*  PO2ART 169.0* 139.0* 135*    Liver Enzymes  Recent Labs Lab 01/26/15 2019  AST 36  ALT 21  ALKPHOS 69  BILITOT 1.2  ALBUMIN 3.2*    Cardiac Enzymes  Recent Labs Lab 01/26/15 2055  TROPONINI 0.35*    Glucose  Recent Labs Lab 01/26/15 2230  GLUCAP 158*    Imaging Dg Chest Portable 1 View  01/26/2015  CLINICAL DATA:  Endotracheal intubation. EXAM: PORTABLE CHEST 1 VIEW COMPARISON:  Chest x-rays from earlier same day and 06/28/2011. FINDINGS: Endotracheal tube appears well positioned with tip approximately 2 cm above the carina. Heart size is normal. Overall cardiomediastinal silhouette is stable in size and configuration compared to earlier exams. Nasogastric tube passes below the diaphragm. Extensive biapical scarring/ fibrosis again noted. Hyperexpanded lungs suggesting COPD. No new lung findings. IMPRESSION: 1. Endotracheal tube well positioned with tip just above the level of the carina. 2. Nasogastric tube with tip in the stomach, however, proximal side holes are at the level of the gastroesophageal junction. Advancement of the NG tube is recommended. 3. Severe biapical scarring/fibrosis and presumed emphysema. No acute-appearing lung findings. Electronically Signed   By: Franki Cabot M.D.   On: 01/26/2015 18:52   Dg Chest Portable 1 View  01/26/2015  CLINICAL DATA:  Respiratory  distress, COPD exacerbation, chest pain with cough for 4 days EXAM: PORTABLE CHEST 1 VIEW COMPARISON:  Portable exam 1623 hours compared to 07/22/2011 FINDINGS: Normal heart size, mediastinal contours and pulmonary vascularity. Extensive BILATERAL upper lobe scarring and volume loss with superior retraction of the hila. Severe emphysematous changes. Overall appearance is unchanged since the previous exam. Minimal atherosclerotic calcification aortic arch. No pleural effusion or pneumothorax. Bones demineralized. IMPRESSION: Severe COPD changes with BILATERAL upper lobe scarring and volume loss. No acute abnormalities. Electronically Signed   By: Lavonia Dana M.D.   On: 01/26/2015 17:28   Dg Abd Portable 1v  01/26/2015  CLINICAL DATA:  74 year old male with a history of orogastric tube placement. EXAM: PORTABLE ABDOMEN - 1 VIEW COMPARISON:  01/26/2015 FINDINGS: Frontal view at the level the diaphragms demonstrates gastric tube terminating in the left upper quadrant. The side port is at the level of the GE junction. Gas within small bowel and colon. No displaced fracture. IMPRESSION: Frontal view of the abdomen at the level of the epigastrium demonstrates gastric  tube terminating in the left upper quadrant with the side port at the level of the GE junction. Signed, Dulcy Fanny. Earleen Newport, DO [facility] Radiology Electronically Signed   By: Corrie Mckusick D.O.   On: 01/26/2015 18:50   DISCUSSION: Chad Velazquez is a 74 y.o. male with severe COPD (FEV1 24% predicted in May 2016) who presents with acute on chronic hypoxic and hypercarbic respiratory failure in the setting of COPD exacerbation, likely from not taking his medications after running out. He had thick brown sputum from his ETT after intubation in the ED which is suspicious for infection.   ASSESSMENT / PLAN:  PULMONARY A: Acute hypoxic respiratory failure in setting of advanced COPD with chronic  hypercarbia - No focal consolidative processes on CXR aside from chronic pulmonary scarring/fibrosis. Patient may be having COPD exacerbation from infection versus lack of medications. He is now mechanically ventilated. P:   - Methylprednisolone 40mg  q24h  - Antibiotics - Continue mechanical ventilation with VAP bundle and early wean screens  CARDIOVASCULAR A:  Chest pain, abnormal EKG - STEMI pager was called earlier and decision was made to watch the patient clinically and with labs. Troponin returned at 0.35 which is elevated. May be NSTEMI versus early STEMI with enzyme leak. Patient unfortunately not able to tell us about chest pain now that he is intubated P:  - Will contact cardiology  - Heparin drip ACS nomogram - Aspirin given - Troponin I q6h  RENAL A:   Chronic hypercarbic respiratory acidosis - stable. No renal issues otherwise P:   Monitor  GASTROINTESTINAL A:   No issues   HEMATOLOGIC A:   Leukocytosis secondary to COPD exacerbation  P:  Monitor  INFECTIOUS A:   COPD exacerbation - Will start CAP antibiotic therapy P:   Ceftriaxone and azithromycin requested Will culture sputum Blood cultures sent  NEUROLOGIC A:   Pain/Sedation bundle for mechanical ventilation  P:   - Intermittent benzodiazepines - Continuous fentanyl for now due to mild hypotension making propofol harder to use - Will wean per protocol - RASS target -1 to 0   FAMILY  - Updates: Wife updated by ED via telephone. Has a tracheostomy and unable to vocalize per report  - Inter-disciplinary family meet or Palliative Care meeting due by:  02/02/15  45 minutes of critical care time was spent with this patient admission  Pulmonary and [place] Pager: [phone]  01/26/2015, 11:23 PM

## 2015-01-27 ENCOUNTER — Inpatient Hospital Stay (HOSPITAL_COMMUNITY): Payer: Medicare PPO

## 2015-01-27 DIAGNOSIS — R579 Shock, unspecified: Secondary | ICD-10-CM

## 2015-01-27 DIAGNOSIS — J9601 Acute respiratory failure with hypoxia: Secondary | ICD-10-CM

## 2015-01-27 DIAGNOSIS — J441 Chronic obstructive pulmonary disease with (acute) exacerbation: Secondary | ICD-10-CM

## 2015-01-27 DIAGNOSIS — R7989 Other specified abnormal findings of blood chemistry: Secondary | ICD-10-CM

## 2015-01-27 LAB — BASIC METABOLIC PANEL
ANION GAP: 9 (ref 5–15)
BUN: 12 mg/dL (ref 6–20)
CALCIUM: 7.9 mg/dL — AB (ref 8.9–10.3)
CO2: 24 mmol/L (ref 22–32)
Chloride: 97 mmol/L — ABNORMAL LOW (ref 101–111)
Creatinine, Ser: 1.14 mg/dL (ref 0.61–1.24)
GFR calc Af Amer: >60 mL/min (ref >60)
GLUCOSE: 252 mg/dL — AB (ref 65–99)
Potassium: 4.4 mmol/L (ref 3.5–5.1)
Sodium: 130 mmol/L — ABNORMAL LOW (ref 135–145)

## 2015-01-27 LAB — CBC
HCT: 41 % (ref 39.0–52.0)
HEMOGLOBIN: 13.9 g/dL (ref 13.0–17.0)
MCH: 29.6 pg (ref 26.0–34.0)
MCHC: 33.9 g/dL (ref 30.0–36.0)
MCV: 87.4 fL (ref 78.0–100.0)
PLATELETS: 233 10*3/uL (ref 150–400)
RBC: 4.69 MIL/uL (ref 4.22–5.81)
RDW: 14.2 % (ref 11.5–15.5)
WBC: 15.7 10*3/uL — ABNORMAL HIGH (ref 4.0–10.5)

## 2015-01-27 LAB — BLOOD GAS, ARTERIAL
Acid-base deficit: 1.8 mmol/L (ref 0.0–2.0)
BICARBONATE: 23.1 meq/L (ref 20.0–24.0)
Drawn by: 23604
FIO2: 0.4
MECHVT: 550 mL
O2 Saturation: 98.8 %
PATIENT TEMPERATURE: 98.6
PEEP/CPAP: 5 cmH2O
PO2 ART: 140 mmHg — AB (ref 80.0–100.0)
TCO2: 24.4 mmol/L (ref 0–100)
pCO2 arterial: 43.4 mmHg (ref 35.0–45.0)
pH, Arterial: 7.344 — ABNORMAL LOW (ref 7.350–7.450)

## 2015-01-27 LAB — MAGNESIUM: MAGNESIUM: 2.5 mg/dL — AB (ref 1.7–2.4)

## 2015-01-27 LAB — HEPARIN LEVEL (UNFRACTIONATED)
Heparin Unfractionated: 0.3 IU/mL (ref 0.30–0.70)
Heparin Unfractionated: <0.1 IU/mL — ABNORMAL LOW (ref 0.30–0.70)

## 2015-01-27 LAB — MRSA PCR SCREENING: MRSA by PCR: NEGATIVE

## 2015-01-27 LAB — TROPONIN I
TROPONIN I: 0.71 ng/mL — AB (ref <0.031)
Troponin I: 0.66 ng/mL (ref <0.031)

## 2015-01-27 LAB — PHOSPHORUS: PHOSPHORUS: 3.4 mg/dL (ref 2.5–4.6)

## 2015-01-27 LAB — LACTIC ACID, PLASMA: Lactic Acid, Venous: 1.3 mmol/L (ref 0.5–2.0)

## 2015-01-27 MED ORDER — METHYLPREDNISOLONE SODIUM SUCC 125 MG IJ SOLR
60.0000 mg | Freq: Four times a day (QID) | INTRAMUSCULAR | Status: DC
  Administered 2015-01-27 – 2015-01-30 (×12): 60 mg via INTRAVENOUS
  Filled 2015-01-27 (×13): qty 2

## 2015-01-27 MED ORDER — IPRATROPIUM-ALBUTEROL 0.5-2.5 (3) MG/3ML IN SOLN
RESPIRATORY_TRACT | Status: AC
  Filled 2015-01-27: qty 3

## 2015-01-27 MED ORDER — CHLORHEXIDINE GLUCONATE 0.12% ORAL RINSE (MEDLINE KIT)
15.0000 mL | Freq: Two times a day (BID) | OROMUCOSAL | Status: DC
  Administered 2015-01-27: 15 mL via OROMUCOSAL

## 2015-01-27 MED ORDER — IPRATROPIUM-ALBUTEROL 0.5-2.5 (3) MG/3ML IN SOLN
3.0000 mL | Freq: Four times a day (QID) | RESPIRATORY_TRACT | Status: DC
  Administered 2015-01-27 – 2015-01-28 (×3): 3 mL via RESPIRATORY_TRACT
  Filled 2015-01-27 (×3): qty 3

## 2015-01-27 MED ORDER — ALPRAZOLAM 0.25 MG PO TABS
0.2500 mg | ORAL_TABLET | Freq: Two times a day (BID) | ORAL | Status: DC | PRN
  Administered 2015-01-30 – 2015-01-31 (×3): 0.25 mg via ORAL
  Filled 2015-01-27 (×3): qty 1

## 2015-01-27 MED ORDER — NOREPINEPHRINE BITARTRATE 1 MG/ML IV SOLN
0.0000 ug/min | INTRAVENOUS | Status: DC
  Administered 2015-01-27: 2 ug/min via INTRAVENOUS
  Filled 2015-01-27 (×2): qty 4

## 2015-01-27 MED ORDER — HEPARIN BOLUS VIA INFUSION
1000.0000 [IU] | Freq: Once | INTRAVENOUS | Status: AC
  Administered 2015-01-27: 1000 [IU] via INTRAVENOUS
  Filled 2015-01-27: qty 1000

## 2015-01-27 MED ORDER — ANTISEPTIC ORAL RINSE SOLUTION (CORINZ)
7.0000 mL | Freq: Four times a day (QID) | OROMUCOSAL | Status: DC
  Administered 2015-01-27 – 2015-01-29 (×9): 7 mL via OROMUCOSAL

## 2015-01-27 NOTE — Progress Notes (Signed)
eLink Physician-Brief Progress Note Patient Name: Chad Velazquez DOB: 14-Aug-1940 MRN: [id number]   Date of Service  01/27/2015  HPI/Events of Note  Patient intubated yesterday for COPD exac.  He is doing well with SBT.  eICU Interventions  Extubate Stop fentanyl drip Bipap prn for increased work of breathing     Intervention Category Major Interventions: Respiratory failure - evaluation and management  Mauri Brooklyn, P 01/27/2015, 3:46 PM

## 2015-01-27 NOTE — Progress Notes (Signed)
CRITICAL VALUE ALERT  Critical value received:  Troponin 0.71  Date of notification:  01/27/2015  Time of notification:  0345  Critical value read back:Yes.    Nurse who received alert:  Eleonore Chiquito RN  MD notified (1st page):  Dr. Micah Flesher  Time of first page:  [phone]  MD notified (2nd page):  Time of second page:  Responding MD:  Dr. Micah Flesher  Time MD responded:  [phone]

## 2015-01-27 NOTE — Progress Notes (Signed)
SUBJECTIVE:  Intubated and agitated.  Seems to deny chest pain   PHYSICAL EXAM Filed Vitals:   01/27/15 1200 01/27/15 1246 01/27/15 1300 01/27/15 1400  BP: 162/90  144/95 110/55  Pulse: 125  127 119  Temp:  98.4 F (36.9 C)    TempSrc:  Oral    Resp: 19  28 9   Height:      Weight:      SpO2: 97%  97% 98%   General:  Intubated awake Lungs:  Decreased breath sounds Heart:  Tachycardia Abdomen:  Positive bowel sounds, no rebound no guarding Extremities:  No edema  LABS: Lab Results  Component Value Date   TROPONINI 0.66* 01/27/2015   Results for orders placed or performed during the hospital encounter of 01/26/15 (from the past 24 hour(s))  Basic metabolic panel     Status: Abnormal   Collection Time: 01/26/15  5:01 PM  Result Value Ref Range   Sodium 131 (L) 135 - 145 mmol/L   Potassium 4.3 3.5 - 5.1 mmol/L   Chloride 95 (L) 101 - 111 mmol/L   CO2 27 22 - 32 mmol/L   Glucose, Bld 164 (H) 65 - 99 mg/dL   BUN 8 6 - 20 mg/dL   Creatinine, Ser 0.80 0.61 - 1.24 mg/dL   Calcium 9.4 8.9 - 10.3 mg/dL   GFR calc non Af Amer >60 >60 mL/min   GFR calc Af Amer >60 >60 mL/min   Anion gap 9 5 - 15  CBC with Differential     Status: Abnormal   Collection Time: 01/26/15  5:01 PM  Result Value Ref Range   WBC 15.4 (H) 4.0 - 10.5 K/uL   RBC 5.67 4.22 - 5.81 MIL/uL   Hemoglobin 16.6 13.0 - 17.0 g/dL   HCT 49.0 39.0 - 52.0 %   MCV 86.4 78.0 - 100.0 fL   MCH 29.3 26.0 - 34.0 pg   MCHC 33.9 30.0 - 36.0 g/dL   RDW 14.1 11.5 - 15.5 %   Platelets 297 150 - 400 K/uL   Neutrophils Relative % 70 %   Neutro Abs 10.9 (H) 1.7 - 7.7 K/uL   Lymphocytes Relative 19 %   Lymphs Abs 2.9 0.7 - 4.0 K/uL   Monocytes Relative 7 %   Monocytes Absolute 1.0 0.1 - 1.0 K/uL   Eosinophils Relative 4 %   Eosinophils Absolute 0.6 0.0 - 0.7 K/uL   Basophils Relative 0 %   Basophils Absolute 0.0 0.0 - 0.1 K/uL  APTT     Status: None   Collection Time: 01/26/15  5:01 PM  Result Value Ref Range   aPTT 31 24 - 37 seconds  Protime-INR     Status: None   Collection Time: 01/26/15  5:01 PM  Result Value Ref Range   Prothrombin Time 14.8 11.6 - 15.2 seconds   INR 1.14 0.00 - 1.49  I-stat troponin, ED     Status: None   Collection Time: 01/26/15  5:07 PM  Result Value Ref Range   Troponin i, poc 0.02 0.00 - 0.08 ng/mL   Comment 3          Culture, expectorated sputum-assessment     Status: None   Collection Time: 01/26/15  5:29 PM  Result Value Ref Range   Specimen Description SPUTUM    Special Requests Normal    Sputum evaluation      THIS SPECIMEN IS ACCEPTABLE. RESPIRATORY CULTURE REPORT TO FOLLOW.   Report Status 01/26/2015  FINAL   I-Stat arterial blood gas, ED     Status: Abnormal   Collection Time: 01/26/15  5:39 PM  Result Value Ref Range   pH, Arterial 7.266 (L) 7.350 - 7.450   pCO2 arterial 63.3 (HH) 35.0 - 45.0 mmHg   pO2, Arterial 169.0 (H) 80.0 - 100.0 mmHg   Bicarbonate 28.8 (H) 20.0 - 24.0 mEq/L   TCO2 31 0 - 100 mmol/L   O2 Saturation 99.0 %   Patient temperature 98.6 F    Collection site RADIAL, ALLEN'S TEST ACCEPTABLE    Drawn by Operator    Sample type ARTERIAL    Comment MD NOTIFIED, REPEAT TEST   I-Stat arterial blood gas, ED     Status: Abnormal   Collection Time: 01/26/15  7:31 PM  Result Value Ref Range   pH, Arterial 7.226 (L) 7.350 - 7.450   pCO2 arterial 58.0 (HH) 35.0 - 45.0 mmHg   pO2, Arterial 139.0 (H) 80.0 - 100.0 mmHg   Bicarbonate 24.1 (H) 20.0 - 24.0 mEq/L   TCO2 26 0 - 100 mmol/L   O2 Saturation 99.0 %   Acid-base deficit 5.0 (H) 0.0 - 2.0 mmol/L   Patient temperature 98.6 F    Collection site RADIAL, ALLEN'S TEST ACCEPTABLE    Drawn by Operator    Sample type ARTERIAL    Comment NOTIFIED PHYSICIAN   Triglycerides     Status: None   Collection Time: 01/26/15  8:18 PM  Result Value Ref Range   Triglycerides 72 <150 mg/dL  Comprehensive metabolic panel     Status: Abnormal   Collection Time: 01/26/15  8:19 PM  Result Value Ref  Range   Sodium 134 (L) 135 - 145 mmol/L   Potassium 3.5 3.5 - 5.1 mmol/L   Chloride 97 (L) 101 - 111 mmol/L   CO2 25 22 - 32 mmol/L   Glucose, Bld 181 (H) 65 - 99 mg/dL   BUN 8 6 - 20 mg/dL   Creatinine, Ser 1.16 0.61 - 1.24 mg/dL   Calcium 8.5 (L) 8.9 - 10.3 mg/dL   Total Protein 6.4 (L) 6.5 - 8.1 g/dL   Albumin 3.2 (L) 3.5 - 5.0 g/dL   AST 36 15 - 41 U/L   ALT 21 17 - 63 U/L   Alkaline Phosphatase 69 38 - 126 U/L   Total Bilirubin 1.2 0.3 - 1.2 mg/dL   GFR calc non Af Amer 60 (L) >60 mL/min   GFR calc Af Amer >60 >60 mL/min   Anion gap 12 5 - 15  Urinalysis, Routine w reflex microscopic (not at [facility])     Status: Abnormal   Collection Time: 01/26/15  8:49 PM  Result Value Ref Range   Color, Urine YELLOW YELLOW   APPearance CLEAR CLEAR   Specific Gravity, Urine 1.014 1.005 - 1.030   pH 5.5 5.0 - 8.0   Glucose, UA NEGATIVE NEGATIVE mg/dL   Hgb urine dipstick TRACE (A) NEGATIVE   Bilirubin Urine NEGATIVE NEGATIVE   Ketones, ur NEGATIVE NEGATIVE mg/dL   Protein, ur 100 (A) NEGATIVE mg/dL   Nitrite NEGATIVE NEGATIVE   Leukocytes, UA NEGATIVE NEGATIVE  Urine microscopic-add on     Status: Abnormal   Collection Time: 01/26/15  8:49 PM  Result Value Ref Range   Squamous Epithelial / LPF 0-5 (A) NONE SEEN   WBC, UA 0-5 0 - 5 WBC/hpf   RBC / HPF 0-5 0 - 5 RBC/hpf   Bacteria, UA RARE (A) NONE SEEN  Casts HYALINE CASTS (A) NEGATIVE  Troponin I (q 6hr x 3)     Status: Abnormal   Collection Time: 01/26/15  8:55 PM  Result Value Ref Range   Troponin I 0.35 (H) <0.031 ng/mL  MRSA PCR Screening     Status: None   Collection Time: 01/26/15  9:50 PM  Result Value Ref Range   MRSA by PCR NEGATIVE NEGATIVE  Glucose, capillary     Status: Abnormal   Collection Time: 01/26/15 10:30 PM  Result Value Ref Range   Glucose-Capillary 158 (H) 65 - 99 mg/dL   Comment 1 Capillary Specimen    Comment 2 Notify RN   Blood gas, arterial     Status: Abnormal   Collection Time: 01/26/15 11:05 PM    Result Value Ref Range   FIO2 0.40    Delivery systems VENTILATOR    Mode PRESSURE REGULATED VOLUME CONTROL    VT 550 mL   LHR 18 resp/min   Peep/cpap 5.0 cm H20   pH, Arterial 7.294 (L) 7.350 - 7.450   pCO2 arterial 46.3 (H) 35.0 - 45.0 mmHg   pO2, Arterial 135 (H) 80.0 - 100.0 mmHg   Bicarbonate 21.8 20.0 - 24.0 mEq/L   TCO2 23.2 0 - 100 mmol/L   Acid-base deficit 3.7 (H) 0.0 - 2.0 mmol/L   O2 Saturation 98.6 %   Patient temperature 98.6    Collection site RIGHT BRACHIAL    Drawn by [id number]    Sample type ARTERIAL    Allens test (pass/fail) NOT INDICATED (A) PASS  Troponin I (q 6hr x 3)     Status: Abnormal   Collection Time: 01/27/15  2:59 AM  Result Value Ref Range   Troponin I 0.71 (HH) <0.031 ng/mL  Heparin level (unfractionated)     Status: None   Collection Time: 01/27/15  2:59 AM  Result Value Ref Range   Heparin Unfractionated 0.30 0.30 - 0.70 IU/mL  Basic metabolic panel     Status: Abnormal   Collection Time: 01/27/15  2:59 AM  Result Value Ref Range   Sodium 130 (L) 135 - 145 mmol/L   Potassium 4.4 3.5 - 5.1 mmol/L   Chloride 97 (L) 101 - 111 mmol/L   CO2 24 22 - 32 mmol/L   Glucose, Bld 252 (H) 65 - 99 mg/dL   BUN 12 6 - 20 mg/dL   Creatinine, Ser 1.14 0.61 - 1.24 mg/dL   Calcium 7.9 (L) 8.9 - 10.3 mg/dL   GFR calc non Af Amer >60 >60 mL/min   GFR calc Af Amer >60 >60 mL/min   Anion gap 9 5 - 15  CBC     Status: Abnormal   Collection Time: 01/27/15  2:59 AM  Result Value Ref Range   WBC 15.7 (H) 4.0 - 10.5 K/uL   RBC 4.69 4.22 - 5.81 MIL/uL   Hemoglobin 13.9 13.0 - 17.0 g/dL   HCT 41.0 39.0 - 52.0 %   MCV 87.4 78.0 - 100.0 fL   MCH 29.6 26.0 - 34.0 pg   MCHC 33.9 30.0 - 36.0 g/dL   RDW 14.2 11.5 - 15.5 %   Platelets 233 150 - 400 K/uL  Magnesium     Status: Abnormal   Collection Time: 01/27/15  2:59 AM  Result Value Ref Range   Magnesium 2.5 (H) 1.7 - 2.4 mg/dL  Phosphorus     Status: None   Collection Time: 01/27/15  2:59 AM  Result Value Ref  Range  Phosphorus 3.4 2.5 - 4.6 mg/dL  Blood gas, arterial     Status: Abnormal   Collection Time: 01/27/15  4:20 AM  Result Value Ref Range   FIO2 0.40    Delivery systems VENTILATOR    Mode PRESSURE REGULATED VOLUME CONTROL    VT 550 mL   Peep/cpap 5.0 cm H20   pH, Arterial 7.344 (L) 7.350 - 7.450   pCO2 arterial 43.4 35.0 - 45.0 mmHg   pO2, Arterial 140 (H) 80.0 - 100.0 mmHg   Bicarbonate 23.1 20.0 - 24.0 mEq/L   TCO2 24.4 0 - 100 mmol/L   Acid-base deficit 1.8 0.0 - 2.0 mmol/L   O2 Saturation 98.8 %   Patient temperature 98.6    Collection site BRACHIAL ARTERY    Drawn by [id number]    Sample type ARTERIAL DRAW    Allens test (pass/fail) PASS PASS  Troponin I (q 6hr x 3)     Status: Abnormal   Collection Time: 01/27/15  9:22 AM  Result Value Ref Range   Troponin I 0.66 (HH) <0.031 ng/mL  Lactic acid, plasma     Status: None   Collection Time: 01/27/15  9:22 AM  Result Value Ref Range   Lactic Acid, Venous 1.3 0.5 - 2.0 mmol/L  Heparin level (unfractionated)     Status: Abnormal   Collection Time: 01/27/15  1:18 PM  Result Value Ref Range   Heparin Unfractionated <0.10 (L) 0.30 - 0.70 IU/mL    Intake/Output Summary (Last 24 hours) at 01/27/15 1458 Last data filed at 01/27/15 1400  Gross per 24 hour  Intake 1317.86 ml  Output    100 ml  Net 1217.86 ml     ASSESSMENT AND PLAN:  ELEVATED TROPONIN:  The troponin trend is consistent with demand ischemia.  He does not seem to be indicating chest pain.  Need a repeat EKG.  No acute cardiac intervention indicated.   Agree with ASA and heparin for now.  Echo is pending.    Minus Breeding 01/27/2015 2:58 PM

## 2015-01-27 NOTE — Procedures (Signed)
CENTRAL LINE INSERTION NOTE  Type: Right IJ Triple lumen CVC placement  Time: 01/27/15 0100 Indication: Septic shock, need for central access for vasoactive medications Consent: Emergent procedure. Family not answering telephone for consent to be obtained.  Procedure: Patient was placed in Trendelenburg position. Right internal jugular vein was visualized with bedside ultrasound. Area was scrubbed in chlorhexadine and draped in sterile fashion. A sterile ultrasound probe was used to visualize the vessel once draped and a vein finder was used to access the right IJ vein on second pass. Venous blood was aspirated and venous placement confirmed after guidewire passed through the vein finder was visualized traversing the IJ vein. The vein was dilated and the central line placed through Seldinger technique, the wuire was removed and the line was sutured in four locations. The area was covered with sterile dressing.  Plan: Follow up CXR ordered for line placement verification  Beverely Low MD

## 2015-01-27 NOTE — Progress Notes (Signed)
PULMONARY / CRITICAL CARE MEDICINE History and Physical  Name: Chad Velazquez MRN: [id number] DOB: [birth year]    ADMISSION DATE:  01/26/2015 CONSULTATION DATE:  01/26/15  REFERRING MD:  Dr Reather Converse, MD  CHIEF COMPLAINT:  Respiratory failure  HISTORY OF PRESENT ILLNESS:   Chad Velazquez is a 74 y.o. male with advanced COPD who presents with shortness of breath. Per the ED staff, the patient reported 4 days of worsening shortness of breath after running out of his inhaler therapies for COPD. We are uncertain if he had fevers, chills, URI symptoms or changes in his sputum production. He received Methylprednisolone en route by EMS. In the ED, he was found to be hypoxic and hypercarbic. After a trial of BiPAP, the patient requested intubation due to continued dyspnea. He was transiently hypotensive after intubation and sedation but responded to fluid boluses. A CXR was done and revealed hyperinflated lungs, chronic scarring but no focal consolidations which were new. He reportedly had chest pain as well with EKG having concerns for ST segment elevation in V3-5. Cardiology was called but believe this is secondary to stress from his respiratory status. A POC troponin was negative in the ED.   Interval events / Subjective:  Tolerating some PSV now.  Slight residual wheeze  VITAL SIGNS: BP 97/67 mmHg  Pulse 105  Temp(Src) 97.4 F (36.3 C) (Oral)  Resp 20  Ht 5\' 8"  (1.727 m)  Wt 57.2 kg (126 lb 1.7 oz)  BMI 19.18 kg/m2  SpO2 100%  VENTILATOR SETTINGS: Vent Mode:  [-] PSV;CPAP FiO2 (%):  [40 %] 40 % Set Rate:  [16 bmp-20 bmp] 20 bmp Vt Set:  [550 mL-600 mL] 550 mL PEEP:  [5 cmH20] 5 cmH20 Pressure Support:  [5 cmH20] 5 cmH20 Plateau Pressure:  [18 cmH20-19 cmH20] 18 cmH20  INTAKE / OUTPUT: I/O last 3 completed shifts: In: 1109.1 [I.V.:1079.1; NG/GT:30] Out: 100 [Urine:90; Emesis/NG output:10]  PHYSICAL EXAMINATION: General:  Sedated, no acute distress, on mechanical  ventilator Neuro:  Wakes easily, mild agitation when stimulated, moves all ext HEENT:  Moist membranes, ETT in place, normal sclera Cardiovascular:  Tachycardic 120, regular rhythm, II/VI systolic murmur over LSB Lungs:  CTAB except for mild expiratory wheezing best heard on L, overall distant Abdomen:  Soft, nontender, nondistended Musculoskeletal:  No focal findings Skin:  No rash  LABS:  BMET  Recent Labs Lab 01/26/15 1701 01/26/15 2019 01/27/15 0259  NA 131* 134* 130*  K 4.3 3.5 4.4  CL 95* 97* 97*  CO2 27 25 24   BUN 8 8 12   CREATININE 0.80 1.16 1.14  GLUCOSE 164* 181* 252*    Electrolytes  Recent Labs Lab 01/26/15 1701 01/26/15 2019 01/27/15 0259  CALCIUM 9.4 8.5* 7.9*  MG  --   --  2.5*  PHOS  --   --  3.4    CBC  Recent Labs Lab 01/26/15 1701 01/27/15 0259  WBC 15.4* 15.7*  HGB 16.6 13.9  HCT 49.0 41.0  PLT 297 233    Coag's  Recent Labs Lab 01/26/15 1701  APTT 31  INR 1.14    Sepsis Markers No results for input(s): LATICACIDVEN, PROCALCITON, O2SATVEN in the last 168 hours.  ABG  Recent Labs Lab 01/26/15 1931 01/26/15 2305 01/27/15 0420  PHART 7.226* 7.294* 7.344*  PCO2ART 58.0* 46.3* 43.4  PO2ART 139.0* 135* 140*    Liver Enzymes  Recent Labs Lab 01/26/15 2019  AST 36  ALT 21  ALKPHOS 69  BILITOT 1.2  ALBUMIN  3.2*    Cardiac Enzymes  Recent Labs Lab 01/26/15 2055 01/27/15 0259  TROPONINI 0.35* 0.71*    Glucose  Recent Labs Lab 01/26/15 2230  GLUCAP 158*    Imaging Dg Chest Port 1 View  01/27/2015  CLINICAL DATA:  Central line placement. EXAM: PORTABLE CHEST 1 VIEW COMPARISON:  Yesterday at 1826 hour FINDINGS: Tip of the right central line projects over the region of the mid SVC. No evidence of pneumothorax. Endotracheal tube at the thoracic inlet. Enteric tube in place, side-port in the region of the distal esophagus. Lungs remain hyperinflated with emphysema and biapical pleural parenchymal scarring.  IMPRESSION: 1. Tip of the right central line in the mid SVC.  No pneumothorax. 2. Side port of the enteric tube remains in the distal esophagus. Advancement recommended. 3. Hyperinflation with emphysema and biapical pleural parenchymal scarring. Electronically Signed   By: Jeb Levering M.D.   On: 01/27/2015 01:36   Dg Chest Portable 1 View  01/26/2015  CLINICAL DATA:  Endotracheal intubation. EXAM: PORTABLE CHEST 1 VIEW COMPARISON:  Chest x-rays from earlier same day and 06/28/2011. FINDINGS: Endotracheal tube appears well positioned with tip approximately 2 cm above the carina. Heart size is normal. Overall cardiomediastinal silhouette is stable in size and configuration compared to earlier exams. Nasogastric tube passes below the diaphragm. Extensive biapical scarring/ fibrosis again noted. Hyperexpanded lungs suggesting COPD. No new lung findings. IMPRESSION: 1. Endotracheal tube well positioned with tip just above the level of the carina. 2. Nasogastric tube with tip in the stomach, however, proximal side holes are at the level of the gastroesophageal junction. Advancement of the NG tube is recommended. 3. Severe biapical scarring/fibrosis and presumed emphysema. No acute-appearing lung findings. Electronically Signed   By: Franki Cabot M.D.   On: 01/26/2015 18:52   Dg Chest Portable 1 View  01/26/2015  CLINICAL DATA:  Respiratory distress, COPD exacerbation, chest pain with cough for 4 days EXAM: PORTABLE CHEST 1 VIEW COMPARISON:  Portable exam 1623 hours compared to 07/22/2011 FINDINGS: Normal heart size, mediastinal contours and pulmonary vascularity. Extensive BILATERAL upper lobe scarring and volume loss with superior retraction of the hila. Severe emphysematous changes. Overall appearance is unchanged since the previous exam. Minimal atherosclerotic calcification aortic arch. No pleural effusion or pneumothorax. Bones demineralized. IMPRESSION: Severe COPD changes with BILATERAL upper lobe  scarring and volume loss. No acute abnormalities. Electronically Signed   By: Lavonia Dana M.D.   On: 01/26/2015 17:28   Dg Abd Portable 1v  01/26/2015  CLINICAL DATA:  74 year old male with a history of orogastric tube placement. EXAM: PORTABLE ABDOMEN - 1 VIEW COMPARISON:  01/26/2015 FINDINGS: Frontal view at the level the diaphragms demonstrates gastric tube terminating in the left upper quadrant. The side port is at the level of the GE junction. Gas within small bowel and colon. No displaced fracture. IMPRESSION: Frontal view of the abdomen at the level of the epigastrium demonstrates gastric tube terminating in the left upper quadrant with the side port at the level of the GE junction. Signed, Dulcy Fanny. Earleen Newport, DO [facility] Radiology Electronically Signed   By: Corrie Mckusick D.O.   On: 01/26/2015 18:50   DISCUSSION: Chad Velazquez is a 74 y.o. male with severe COPD (FEV1 24% predicted in May 2016) who presents with acute on chronic hypoxic and hypercarbic respiratory failure in the setting of COPD exacerbation, likely from not taking his medications after running out. He had thick brown sputum from his  ETT after intubation in the ED which is suspicious for infection.   ASSESSMENT / PLAN:  PULMONARY A: Acute hypoxic respiratory failure in setting of advanced COPD with chronic hypercarbia - No focal consolidative processes on CXR aside from chronic pulmonary scarring/fibrosis. Patient may be having COPD exacerbation from infection versus lack of medications. He is now mechanically ventilated. P:   - Methylprednisolone 60mg  q6h x 1 day then plan to wean - Antibiotics as below - Continue mechanical ventilation with VAP bundle and early wean screens  CARDIOVASCULAR A:  NSTEMI, likely due to resp stress Shock, septic vs cardiogenic vs meds/sedation P:  - Cardiology following - Heparin drip for ACS - Aspirin given - Troponin I q6h - norepi  running, wean as able - CVP's to guide volume status - follow lactate - check TTE  RENAL A:   Chronic hypercarbic respiratory acidosis - stable. No renal issues otherwise P:   Monitor  GASTROINTESTINAL A:   No issues   HEMATOLOGIC A:   Leukocytosis secondary to COPD exacerbation  P:  Monitor  INFECTIOUS A:   COPD exacerbation - antibiotic therapy to cover possible CAP.  P:   Ceftriaxone and azithromycin started 12/17 pm Follow blood and resp cx's, narrow abx as cx , Pct and CXR indicate  NEUROLOGIC A:   Pain/Sedation bundle for mechanical ventilation  P:   - Intermittent benzodiazepines - Continuous fentanyl - Will wean per protocol - RASS target -1 to 0   FAMILY  - Updates: Wife updated by ED via telephone by Dr Verl Dicker 12/18 am. She has a tracheostomy and unable to vocalize per report  - Inter-disciplinary family meet or Palliative Care meeting due by:  02/02/15  Independent CC time 82 minutes   Baltazar Apo, MD, PhD 01/27/2015, 9:10 AM [facility] [phone] or if no answer [phone]

## 2015-01-27 NOTE — Progress Notes (Signed)
ANTICOAGULATION CONSULT NOTE - Follow Up Consult  Pharmacy Consult for heparin Indication: NSTEMI vs early STEMI   Labs:  Recent Labs  01/26/15 1701 01/26/15 2019 01/26/15 2055 01/27/15 0259  HGB 16.6  --   --  13.9  HCT 49.0  --   --  41.0  PLT 297  --   --  233  APTT 31  --   --   --   LABPROT 14.8  --   --   --   INR 1.14  --   --   --   HEPARINUNFRC  --   --   --  0.30  CREATININE 0.80 1.16  --   --   TROPONINI  --   --  0.35*  --     Assessment: 74yo male therapeutic on heparin with initial dosing for ACS though at very low end of goal.  Goal of Therapy:  Heparin level 0.3-0.7 units/ml   Plan:  Will increase heparin gtt very slightly to 700 units/hr and check level in [place], PharmD, BCPS  01/27/2015,3:28 AM

## 2015-01-27 NOTE — Progress Notes (Signed)
[facility] (Dr. Tamala Julian) orginally notified of critical troponin of 0.71. Instructed to call cards fellow and inform of result. Dr. Clayborne Artist with cards spoken with and no new orders received. Will continue to monitor closely. Eleonore Chiquito Rn [place]

## 2015-01-27 NOTE — Progress Notes (Signed)
Utilization Review Completed.Chad Velazquez T12/18/2016  

## 2015-01-27 NOTE — Progress Notes (Signed)
After central line insertion MD Beverely Low stated he did not have time to put foley cathter in and would have AM MD attempt or consult urology. Condom cath placed and will continue to monitor. Eleonore Chiquito RN 2 [place].

## 2015-01-27 NOTE — Progress Notes (Signed)
Order for 9fr foley catheter attempted to be carried out. Insertion was attempted twice with 3 rn's present. Myself, Bosie Clos RN, and Jossie Ng RN all attempted to place per procedure. After two unsuccessful attempts Elink notified who instructed that the fellow would round to put it in.  Eleonore Chiquito RN [place]

## 2015-01-27 NOTE — Procedures (Signed)
Extubation Procedure Note  Patient Details:   Name: Chad Velazquez DOB: 13-Aug-1940 MRN: [id number]   Airway Documentation:     Evaluation  O2 sats: stable throughout Complications: No apparent complications Patient did tolerate procedure well. Bilateral Breath Sounds: Clear Suctioning: Airway Yes   Patient extubated to 4L nasal cannula per MD order.  Positive cuff leak noted.  No evidence of stridor.  Patient able to speak post extubation.  Sats currently 93%.  Vitals are stable.  No apparent complications.  Philomena Doheny 01/27/2015, 4:48 PM

## 2015-01-27 NOTE — Progress Notes (Signed)
ANTICOAGULATION CONSULT NOTE - Follow Up Consult  Pharmacy Consult for heparin Indication: NSTEMI  No Known Allergies  Patient Measurements: Height: 5\' 8"  (172.7 cm) Weight: 126 lb 1.7 oz (57.2 kg) IBW/kg (Calculated) : 68.4  Vital Signs: Temp: 98.4 F (36.9 C) (12/18 1246) Temp Source: Oral (12/18 1246) BP: 144/95 mmHg (12/18 1300) Pulse Rate: 127 (12/18 1300)  Labs:  Recent Labs  01/26/15 1701 01/26/15 2019 01/26/15 2055 01/27/15 0259 01/27/15 0922 01/27/15 1318  HGB 16.6  --   --  13.9  --   --   HCT 49.0  --   --  41.0  --   --   PLT 297  --   --  233  --   --   APTT 31  --   --   --   --   --   LABPROT 14.8  --   --   --   --   --   INR 1.14  --   --   --   --   --   HEPARINUNFRC  --   --   --  0.30  --  <0.10*  CREATININE 0.80 1.16  --  1.14  --   --   TROPONINI  --   --  0.35* 0.71* 0.66*  --     Estimated Creatinine Clearance: 45.3 mL/min (by C-G formula based on Cr of 1.14).   Medications:  Infusions:  . fentaNYL infusion INTRAVENOUS 125 mcg/hr (01/27/15 1300)  . heparin 700 Units/hr (01/27/15 0328)  . norepinephrine (LEVOPHED) Adult infusion Stopped (01/27/15 1100)    Assessment: 74 y/o male with COPD who presents to the ED with respiratory distress, chest pain, and cough for 4 days. He continues on IV heparin for and NSTEMI. Heparin level is now <0.1 on same rate as heparin level from early this morning. Spoke with RN and infusing through peripheral and drawn from central line. No problems or interruptions noted. Unsure why heparin is undetectable now. No bleeding noted, CBC is normal.  Goal of Therapy:  Heparin level 0.3-0.7 units/ml Monitor platelets by anticoagulation protocol: Yes   Plan:  - Heparin 1000 units IV bolus then increase drip to 900 units/hr - 8 hr HL - Daily HL and CBC - Monitor for s/sx of bleeding  [facility], [place].D., BCPS Clinical Pharmacist Pager: [phone] 01/27/2015 2:15 PM

## 2015-01-28 ENCOUNTER — Inpatient Hospital Stay (HOSPITAL_COMMUNITY): Payer: Medicare PPO

## 2015-01-28 DIAGNOSIS — R778 Other specified abnormalities of plasma proteins: Secondary | ICD-10-CM | POA: Diagnosis present

## 2015-01-28 DIAGNOSIS — R7989 Other specified abnormal findings of blood chemistry: Secondary | ICD-10-CM

## 2015-01-28 DIAGNOSIS — R0600 Dyspnea, unspecified: Secondary | ICD-10-CM

## 2015-01-28 LAB — BASIC METABOLIC PANEL
Anion gap: 9 (ref 5–15)
BUN: 17 mg/dL (ref 6–20)
CHLORIDE: 94 mmol/L — AB (ref 101–111)
CO2: 30 mmol/L (ref 22–32)
CREATININE: 0.87 mg/dL (ref 0.61–1.24)
Calcium: 8.6 mg/dL — ABNORMAL LOW (ref 8.9–10.3)
GFR calc Af Amer: >60 mL/min (ref >60)
GFR calc non Af Amer: >60 mL/min (ref >60)
Glucose, Bld: 99 mg/dL (ref 65–99)
Potassium: 4.9 mmol/L (ref 3.5–5.1)
Sodium: 133 mmol/L — ABNORMAL LOW (ref 135–145)

## 2015-01-28 LAB — CBC
HEMATOCRIT: 41.4 % (ref 39.0–52.0)
Hemoglobin: 13.6 g/dL (ref 13.0–17.0)
MCH: 29.1 pg (ref 26.0–34.0)
MCHC: 32.9 g/dL (ref 30.0–36.0)
MCV: 88.5 fL (ref 78.0–100.0)
Platelets: 202 10*3/uL (ref 150–400)
RBC: 4.68 MIL/uL (ref 4.22–5.81)
RDW: 14.2 % (ref 11.5–15.5)
WBC: 29.6 10*3/uL — ABNORMAL HIGH (ref 4.0–10.5)

## 2015-01-28 LAB — TROPONIN I
Troponin I: 0.37 ng/mL — ABNORMAL HIGH (ref <0.031)
Troponin I: 0.23 ng/mL — ABNORMAL HIGH (ref <0.031)

## 2015-01-28 LAB — HEPARIN LEVEL (UNFRACTIONATED): Heparin Unfractionated: 0.15 IU/mL — ABNORMAL LOW (ref 0.30–0.70)

## 2015-01-28 LAB — MAGNESIUM: Magnesium: 2.4 mg/dL (ref 1.7–2.4)

## 2015-01-28 LAB — LACTIC ACID, PLASMA: Lactic Acid, Venous: 1.3 mmol/L (ref 0.5–2.0)

## 2015-01-28 LAB — PHOSPHORUS: Phosphorus: 2.8 mg/dL (ref 2.5–4.6)

## 2015-01-28 MED ORDER — PANTOPRAZOLE SODIUM 40 MG PO TBEC
40.0000 mg | DELAYED_RELEASE_TABLET | Freq: Every day | ORAL | Status: DC
  Administered 2015-01-28 – 2015-01-29 (×2): 40 mg via ORAL
  Filled 2015-01-28 (×2): qty 1

## 2015-01-28 MED ORDER — ALUM & MAG HYDROXIDE-SIMETH 200-200-20 MG/5ML PO SUSP
30.0000 mL | Freq: Four times a day (QID) | ORAL | Status: DC | PRN
  Administered 2015-01-29 – 2015-01-31 (×3): 30 mL via ORAL
  Filled 2015-01-28 (×3): qty 30

## 2015-01-28 MED ORDER — HEPARIN BOLUS VIA INFUSION
1500.0000 [IU] | Freq: Once | INTRAVENOUS | Status: AC
  Administered 2015-01-28: 1500 [IU] via INTRAVENOUS
  Filled 2015-01-28: qty 1500

## 2015-01-28 MED ORDER — IBUPROFEN 400 MG PO TABS: 400.0000 mg | ORAL_TABLET | ORAL | Status: DC | PRN

## 2015-01-28 MED ORDER — ONDANSETRON HCL 4 MG/2ML IJ SOLN
4.0000 mg | Freq: Three times a day (TID) | INTRAMUSCULAR | Status: DC | PRN
Start: 2015-01-28 — End: 2015-02-01
  Administered 2015-01-28 – 2015-01-29 (×2): 4 mg via INTRAVENOUS
  Filled 2015-01-28 (×2): qty 2

## 2015-01-28 MED ORDER — GI COCKTAIL ~~LOC~~
30.0000 mL | Freq: Once | ORAL | Status: AC
  Administered 2015-01-28: 30 mL via ORAL
  Filled 2015-01-28: qty 30

## 2015-01-28 MED ORDER — ENSURE ENLIVE PO LIQD
237.0000 mL | Freq: Three times a day (TID) | ORAL | Status: DC
  Administered 2015-01-28 – 2015-01-30 (×5): 237 mL via ORAL

## 2015-01-28 MED ORDER — "THROMBI-PAD 3""X3"" EX PADS"
1.0000 | MEDICATED_PAD | Freq: Once | CUTANEOUS | Status: AC
  Administered 2015-01-28: 1 via TOPICAL
  Filled 2015-01-28: qty 1

## 2015-01-28 MED ORDER — IPRATROPIUM-ALBUTEROL 0.5-2.5 (3) MG/3ML IN SOLN
3.0000 mL | RESPIRATORY_TRACT | Status: DC
  Administered 2015-01-28 (×3): 3 mL via RESPIRATORY_TRACT
  Filled 2015-01-28 (×4): qty 3

## 2015-01-28 MED ORDER — FUROSEMIDE 10 MG/ML IJ SOLN
40.0000 mg | Freq: Two times a day (BID) | INTRAMUSCULAR | Status: DC
  Administered 2015-01-28 – 2015-01-30 (×5): 40 mg via INTRAVENOUS
  Filled 2015-01-28 (×5): qty 4

## 2015-01-28 MED ORDER — LEVALBUTEROL HCL 0.63 MG/3ML IN NEBU
0.6300 mg | INHALATION_SOLUTION | Freq: Four times a day (QID) | RESPIRATORY_TRACT | Status: DC
  Administered 2015-01-29 – 2015-01-31 (×9): 0.63 mg via RESPIRATORY_TRACT
  Filled 2015-01-28 (×12): qty 3

## 2015-01-28 MED ORDER — LEVALBUTEROL HCL 0.63 MG/3ML IN NEBU
0.6300 mg | INHALATION_SOLUTION | Freq: Four times a day (QID) | RESPIRATORY_TRACT | Status: DC
  Administered 2015-01-28: 0.63 mg via RESPIRATORY_TRACT
  Filled 2015-01-28: qty 3

## 2015-01-28 MED ORDER — ALBUTEROL SULFATE (2.5 MG/3ML) 0.083% IN NEBU
2.5000 mg | INHALATION_SOLUTION | RESPIRATORY_TRACT | Status: DC | PRN
  Administered 2015-01-28: 2.5 mg via RESPIRATORY_TRACT
  Filled 2015-01-28: qty 3

## 2015-01-28 NOTE — Progress Notes (Signed)
ANTICOAGULATION CONSULT NOTE - Follow Up Consult  Pharmacy Consult for heparin Indication: NSTEMI  No Known Allergies  Patient Measurements: Height: 5\' 8"  (172.7 cm) Weight: 126 lb 1.7 oz (57.2 kg) IBW/kg (Calculated) : 68.4  Vital Signs: Temp: 98.1 F (36.7 C) (12/18 2349) Temp Source: Oral (12/18 1900) BP: 135/68 mmHg (12/18 2300) Pulse Rate: 124 (12/18 2300)  Labs:  Recent Labs  01/26/15 1701 01/26/15 2019 01/26/15 2055 01/27/15 0259 01/27/15 0922 01/27/15 1318 01/27/15 2351  HGB 16.6  --   --  13.9  --   --   --   HCT 49.0  --   --  41.0  --   --   --   PLT 297  --   --  233  --   --   --   APTT 31  --   --   --   --   --   --   LABPROT 14.8  --   --   --   --   --   --   INR 1.14  --   --   --   --   --   --   HEPARINUNFRC  --   --   --  0.30  --  <0.10* 0.15*  CREATININE 0.80 1.16  --  1.14  --   --   --   TROPONINI  --   --  0.35* 0.71* 0.66*  --   --     Estimated Creatinine Clearance: 45.3 mL/min (by C-G formula based on Cr of 1.14).   Medications:  Infusions:  . heparin 900 Units/hr (01/27/15 2300)  . norepinephrine (LEVOPHED) Adult infusion Stopped (01/27/15 1100)    Assessment: 74 y/o male on IV heparin for NSTEMI. Heparin level subtherapeutic (0.15) on 900 units/hr. No issues with line or bleeding reported per RN.  Goal of Therapy:  Heparin level 0.3-0.7 units/ml Monitor platelets by anticoagulation protocol: Yes   Plan:  - Rebolus heparin 1500 units IV then increase drip to 1100 units/hr - 8 hr HL  Sherlon Handing, PharmD, BCPS Clinical pharmacist, pager [phone] 01/28/2015 12:17 AM

## 2015-01-28 NOTE — Progress Notes (Signed)
Patient states he is feeling nauseous at this time. Will not place patient on BIPAP at this time. SPO2 96% patient in no distress. Will check back with patient at later time to assess if ready for BIPAP. RN made aware.

## 2015-01-28 NOTE — Progress Notes (Signed)
SUBJECTIVE:  Intubated and sedated  OBJECTIVE:   Vitals:   Filed Vitals:   01/28/15 0800 01/28/15 0844 01/28/15 0900 01/28/15 1000  BP: 140/106  134/74 117/72  Pulse: 123  105 123  Temp:      TempSrc:      Resp: 27  20 22   Height:      Weight:      SpO2: 99% 99% 100% 97%   I&O's:   Intake/Output Summary (Last 24 hours) at 01/28/15 1018 Last data filed at 01/28/15 0810  Gross per 24 hour  Intake 1037.33 ml  Output   1820 ml  Net -782.67 ml   TELEMETRY: Reviewed telemetry pt in NSR:     PHYSICAL EXAM General: Well developed, well nourished, in no acute distress Lungs:   Clear bilaterally to auscultation and percussion. Heart:   HRRR S1 S2 Pulses are 2+ & equal. Abdomen: Bowel sounds are positive, abdomen soft and non-tender without masses Extremities:   No clubbing, cyanosis or edema.  DP +1  LABS: Basic Metabolic Panel:  Recent Labs  01/27/15 0259 01/28/15 0535  NA 130* 133*  K 4.4 4.9  CL 97* 94*  CO2 24 30  GLUCOSE 252* 99  BUN 12 17  CREATININE 1.14 0.87  CALCIUM 7.9* 8.6*  MG 2.5* 2.4  PHOS 3.4 2.8   Liver Function Tests:  Recent Labs  01/26/15 2019  AST 36  ALT 21  ALKPHOS 69  BILITOT 1.2  PROT 6.4*  ALBUMIN 3.2*   No results for input(s): LIPASE, AMYLASE in the last 72 hours. CBC:  Recent Labs  01/26/15 1701 01/27/15 0259 01/28/15 0535  WBC 15.4* 15.7* 29.6*  NEUTROABS 10.9*  --   --   HGB 16.6 13.9 13.6  HCT 49.0 41.0 41.4  MCV 86.4 87.4 88.5  PLT 297 233 202   Cardiac Enzymes:  Recent Labs  01/26/15 2055 01/27/15 0259 01/27/15 0922  TROPONINI 0.35* 0.71* 0.66*   BNP: Invalid input(s): POCBNP D-Dimer: No results for input(s): DDIMER in the last 72 hours. Hemoglobin A1C: No results for input(s): HGBA1C in the last 72 hours. Fasting Lipid Panel:  Recent Labs  01/26/15 2018  TRIG 72   Thyroid Function Tests: No results for input(s): TSH, T4TOTAL, T3FREE, THYROIDAB in the last 72 hours.  Invalid input(s):  FREET3 Anemia Panel: No results for input(s): VITAMINB12, FOLATE, FERRITIN, TIBC, IRON, RETICCTPCT in the last 72 hours. Coag Panel:   Lab Results  Component Value Date   INR 1.14 01/26/2015    RADIOLOGY: Dg Chest Port 1 View  01/27/2015  CLINICAL DATA:  Central line placement. EXAM: PORTABLE CHEST 1 VIEW COMPARISON:  Yesterday at 1826 hour FINDINGS: Tip of the right central line projects over the region of the mid SVC. No evidence of pneumothorax. Endotracheal tube at the thoracic inlet. Enteric tube in place, side-port in the region of the distal esophagus. Lungs remain hyperinflated with emphysema and biapical pleural parenchymal scarring. IMPRESSION: 1. Tip of the right central line in the mid SVC.  No pneumothorax. 2. Side port of the enteric tube remains in the distal esophagus. Advancement recommended. 3. Hyperinflation with emphysema and biapical pleural parenchymal scarring. Electronically Signed   By: Jeb Levering M.D.   On: 01/27/2015 01:36   Dg Chest Portable 1 View  01/26/2015  CLINICAL DATA:  Endotracheal intubation. EXAM: PORTABLE CHEST 1 VIEW COMPARISON:  Chest x-rays from earlier same day and 06/28/2011. FINDINGS: Endotracheal tube appears well positioned with tip approximately 2 cm  above the carina. Heart size is normal. Overall cardiomediastinal silhouette is stable in size and configuration compared to earlier exams. Nasogastric tube passes below the diaphragm. Extensive biapical scarring/ fibrosis again noted. Hyperexpanded lungs suggesting COPD. No new lung findings. IMPRESSION: 1. Endotracheal tube well positioned with tip just above the level of the carina. 2. Nasogastric tube with tip in the stomach, however, proximal side holes are at the level of the gastroesophageal junction. Advancement of the NG tube is recommended. 3. Severe biapical scarring/fibrosis and presumed emphysema. No acute-appearing lung findings. Electronically Signed   By: Franki Cabot M.D.   On:  01/26/2015 18:52   Dg Chest Portable 1 View  01/26/2015  CLINICAL DATA:  Respiratory distress, COPD exacerbation, chest pain with cough for 4 days EXAM: PORTABLE CHEST 1 VIEW COMPARISON:  Portable exam 1623 hours compared to 07/22/2011 FINDINGS: Normal heart size, mediastinal contours and pulmonary vascularity. Extensive BILATERAL upper lobe scarring and volume loss with superior retraction of the hila. Severe emphysematous changes. Overall appearance is unchanged since the previous exam. Minimal atherosclerotic calcification aortic arch. No pleural effusion or pneumothorax. Bones demineralized. IMPRESSION: Severe COPD changes with BILATERAL upper lobe scarring and volume loss. No acute abnormalities. Electronically Signed   By: Lavonia Dana M.D.   On: 01/26/2015 17:28   Dg Abd Portable 1v  01/26/2015  CLINICAL DATA:  74 year old male with a history of orogastric tube placement. EXAM: PORTABLE ABDOMEN - 1 VIEW COMPARISON:  01/26/2015 FINDINGS: Frontal view at the level the diaphragms demonstrates gastric tube terminating in the left upper quadrant. The side port is at the level of the GE junction. Gas within small bowel and colon. No displaced fracture. IMPRESSION: Frontal view of the abdomen at the level of the epigastrium demonstrates gastric tube terminating in the left upper quadrant with the side port at the level of the GE junction. Signed, Dulcy Fanny. Earleen Newport, DO [facility] Radiology Electronically Signed   By: Corrie Mckusick D.O.   On: 01/26/2015 18:50    ASSESSMENT AND PLAN:  ELEVATED TROPONIN: The troponin trend is consistent with demand ischemia from acute respiratory failure/shock . He does not seem to be indicating chest pain.No acute cardiac intervention indicated. Troponin elevation with flat trend.  Agree with ASA and heparin for now. Echo is pending.  COPD EXACERBATION with possible PNA - per CCM     Sueanne Margarita, MD    01/28/2015  10:18 AM

## 2015-01-28 NOTE — Progress Notes (Signed)
Initial Nutrition Assessment  DOCUMENTATION CODES:   Severe malnutrition in context of chronic illness, Underweight  INTERVENTION:  Provide Ensure Enlive po TID, each supplement provides 350 kcal and 20 grams of protein.  Encourage adequate PO intake.   NUTRITION DIAGNOSIS:   Malnutrition related to chronic illness as evidenced by severe depletion of body fat, severe depletion of muscle mass  GOAL:   Patient will meet greater than or equal to 90% of their needs  MONITOR:   PO intake, Supplement acceptance, Weight trends, Labs, I & O's, Skin  REASON FOR ASSESSMENT:   Consult Assessment of nutrition requirement/status  ASSESSMENT:   74 y.o. male with advanced COPD who presents with shortness of breath. Per the ED staff, the patient reported 4 days of worsening shortness of breath after running out of his inhaler therapies for COPD. CXR was done and revealed hyperinflated lungs, chronic scarring but no focal consolidations  Pt extubated 12/18.   Pt reports having a lack of appetite since admission. Meal completion 0% this AM. Pt does report having little to no po intake over the past 6 days. Pt reports prior to that usually consuming 2 meals a day at home. Usual body weight reported to be 113 lbs. Pt is agreeable to Ensure to aid in caloric and protein needs. RD to order. Pt was encouraged to eat his food at meals.   Nutrition-Focused physical exam completed. Findings are severe fat depletion, severe muscle depletion, and no edema.   Labs and medications reviewed.   Diet Order:  Diet heart healthy/carb modified Room service appropriate?: Yes; Fluid consistency:: Thin  Skin:  Wound (see comment) (Skin tear L wrist)  Last BM:  Unknown  Height:   Ht Readings from Last 1 Encounters:  01/26/15 5\' 8"  (1.727 m)    Weight:   Wt Readings from Last 1 Encounters:  01/28/15 118 lb 2.7 oz (53.6 kg)    Ideal Body Weight:  70 kg  BMI:  Body mass index is 17.97  kg/(m^2).  Estimated Nutritional Needs:   Kcal:  [id number]  Protein:  80-90 grams  Fluid:  1.7 - 1.9 L/day  EDUCATION NEEDS:   No education needs identified at this time  Corrin Parker, MS, RD, LDN Pager # [phone] After hours/ weekend pager # [phone]

## 2015-01-28 NOTE — Progress Notes (Signed)
eLink Physician-Brief Progress Note Patient Name: Chad Velazquez DOB: 09-06-1940 MRN: [id number]   Date of Service  01/28/2015  HPI/Events of Note  Multiple issues: 1. Patient c/o increased HR with Albuterol/Atrovent Nebs. HR = 125 currently , 2. Patient c/o nausea and 3. Patient c/o acid reflex. Patient is already on Protonix PO Q day.   eICU Interventions  Will order: 1. D/C Duonebs Q 4 hours.  2. Xopenex Nebs Q 6 hours. 3. Zofran 4 mg IV Q 8 hours PRN 4. Mylanta 30 mL PO Q 6 hours PRN heartburn or flatulence.      Intervention Category Intermediate Interventions: Other:  Lysle Dingwall 01/28/2015, 11:25 PM

## 2015-01-28 NOTE — Progress Notes (Signed)
eLink Physician-Brief Progress Note Patient Name: Chad Velazquez DOB: 08-24-1940 MRN: [id number]   Date of Service  01/28/2015  HPI/Events of Note  Increased WOB.   eICU Interventions  Will order: 1. BiPAP 12/5. Respiratory Therapy to adjust IPAP and EPAP.  2. Increase DuoNebs to Q 4 hours. 3. Add Albuterol 2.5 mg via Neb Q 3 hours PRN wheezing or SOB.     Intervention Category Intermediate Interventions: Respiratory distress - evaluation and management  Wilmar Prabhakar Eugene 01/28/2015, 4:57 AM

## 2015-01-28 NOTE — Progress Notes (Signed)
PULMONARY / CRITICAL CARE MEDICINE History and Physical  Name: Chad Velazquez MRN: [id number] DOB: [birth year]    ADMISSION DATE:  01/26/2015 CONSULTATION DATE:  01/26/15  REFERRING MD:  Dr Reather Converse, MD  CHIEF COMPLAINT:  Respiratory failure  BRIEF  Chad Velazquez is a 74 y.o. male with advanced COPD who presents with shortness of breath. Per the ED staff, the patient reported 4 days of worsening shortness of breath after running out of his inhaler therapies for COPD. We are uncertain if he had fevers, chills, URI symptoms or changes in his sputum production. He received Methylprednisolone en route by EMS. In the ED, he was found to be hypoxic and hypercarbic. After a trial of BiPAP, the patient requested intubation due to continued dyspnea. He was transiently hypotensive after intubation and sedation but responded to fluid boluses. A CXR was done and revealed hyperinflated lungs, chronic scarring but no focal consolidations which were new. He reportedly had chest pain as well with EKG having concerns for ST segment elevation in V3-5. Cardiology was called but believe this is secondary to stress from his respiratory status. A POC troponin was negative in the ED.   Interval events / Subjective:  Tolerating some PSV now.  Slight residual wheeze    SUBJECTIVE/OVERNIGHT/INTERVAL HX 01/28/15 - extubated yesterday. Used BiPAP qhs. Pulse ox 100% on 4L [place]. Reports wheezing and epigastric pain. Denies this is chest pain RN reports bleeding around CVL site - On IV heparin. Cards reports demand ischemia. ECHO pending. Overall he is only 30% near baaseline and only some better since admit  VITAL SIGNS: BP 140/106 mmHg  Pulse 123  Temp(Src) 98.5 F (36.9 C) (Oral)  Resp 27  Ht 5\' 8"  (1.727 m)  Wt 118 lb 2.7 oz (53.6 kg)  BMI 17.97 kg/m2  SpO2 99%  VENTILATOR SETTINGS: Vent Mode:  [-] PSV;CPAP FiO2 (%):  [40 %] 40 % PEEP:  [5 cmH20] 5 cmH20 Pressure Support:  [5 cmH20] 5 cmH20  INTAKE /  OUTPUT: I/O last 3 completed shifts: In: 1272.4 [I.V.:642.4; NG/GT:30; IV Piggyback:600] Out: [id number] [Urine:1685; Emesis/NG output:10]  PHYSICAL EXAMINATION: General:  Deconditioned male. Awake, Alert Neuro:  Calm. RASS +1. Moves all 4. Cogniition intact. CAM0-ICU neg for delirum HEENT:  Moist membranes,  normal sclera Cardiovascular:  , regular rhythm, II/VI systolic murmur over LSB Lungs:Bilateral wheezing and purse lip breatuing + (on q4h nebs + IV steroids) Abdomen:  Soft, nontender, nondistended Musculoskeletal:  No focal findings Skin:  No rash  LABS:  PULMONARY  Recent Labs Lab 01/26/15 1739 01/26/15 1931 01/26/15 2305 01/27/15 0420  PHART 7.266* 7.226* 7.294* 7.344*  PCO2ART 63.3* 58.0* 46.3* 43.4  PO2ART 169.0* 139.0* 135* 140*  HCO3 28.8* 24.1* 21.8 23.1  TCO2 31 26 23.2 24.4  O2SAT 99.0 99.0 98.6 98.8    CBC  Recent Labs Lab 01/26/15 1701 01/27/15 0259 01/28/15 0535  HGB 16.6 13.9 13.6  HCT 49.0 41.0 41.4  WBC 15.4* 15.7* 29.6*  PLT 297 233 202    COAGULATION  Recent Labs Lab 01/26/15 1701  INR 1.14    CARDIAC   Recent Labs Lab 01/26/15 2055 01/27/15 0259 01/27/15 0922  TROPONINI 0.35* 0.71* 0.66*   No results for input(s): PROBNP in the last 168 hours.   CHEMISTRY  Recent Labs Lab 01/26/15 1701 01/26/15 2019 01/27/15 0259 01/28/15 0535  NA 131* 134* 130* 133*  K 4.3 3.5 4.4 4.9  CL 95* 97* 97* 94*  CO2 27 25 24 30   GLUCOSE  164* 181* 252* 99  BUN 8 8 12 17   CREATININE 0.80 1.16 1.14 0.87  CALCIUM 9.4 8.5* 7.9* 8.6*  MG  --   --  2.5* 2.4  PHOS  --   --  3.4 2.8   Estimated Creatinine Clearance: 55.6 mL/min (by C-G formula based on Cr of 0.87).   LIVER  Recent Labs Lab 01/26/15 1701 01/26/15 2019  AST  --  36  ALT  --  21  ALKPHOS  --  69  BILITOT  --  1.2  PROT  --  6.4*  ALBUMIN  --  3.2*  INR 1.14  --      INFECTIOUS  Recent Labs Lab 01/27/15 0922 01/28/15 0540  LATICACIDVEN 1.3 1.3      ENDOCRINE CBG (last 3)   Recent Labs  01/26/15 2230  GLUCAP 158*         IMAGING x48h  - image(s) personally visualized  -   highlighted in bold Dg Chest Port 1 View  01/27/2015  CLINICAL DATA:  Central line placement. EXAM: PORTABLE CHEST 1 VIEW COMPARISON:  Yesterday at 1826 hour FINDINGS: Tip of the right central line projects over the region of the mid SVC. No evidence of pneumothorax. Endotracheal tube at the thoracic inlet. Enteric tube in place, side-port in the region of the distal esophagus. Lungs remain hyperinflated with emphysema and biapical pleural parenchymal scarring. IMPRESSION: 1. Tip of the right central line in the mid SVC.  No pneumothorax. 2. Side port of the enteric tube remains in the distal esophagus. Advancement recommended. 3. Hyperinflation with emphysema and biapical pleural parenchymal scarring. Electronically Signed   By: Jeb Levering M.D.   On: 01/27/2015 01:36   Dg Chest Portable 1 View  01/26/2015  CLINICAL DATA:  Endotracheal intubation. EXAM: PORTABLE CHEST 1 VIEW COMPARISON:  Chest x-rays from earlier same day and 06/28/2011. FINDINGS: Endotracheal tube appears well positioned with tip approximately 2 cm above the carina. Heart size is normal. Overall cardiomediastinal silhouette is stable in size and configuration compared to earlier exams. Nasogastric tube passes below the diaphragm. Extensive biapical scarring/ fibrosis again noted. Hyperexpanded lungs suggesting COPD. No new lung findings. IMPRESSION: 1. Endotracheal tube well positioned with tip just above the level of the carina. 2. Nasogastric tube with tip in the stomach, however, proximal side holes are at the level of the gastroesophageal junction. Advancement of the NG tube is recommended. 3. Severe biapical scarring/fibrosis and presumed emphysema. No acute-appearing lung findings. Electronically Signed   By: Franki Cabot M.D.   On: 01/26/2015 18:52   Dg Chest Portable 1  View  01/26/2015  CLINICAL DATA:  Respiratory distress, COPD exacerbation, chest pain with cough for 4 days EXAM: PORTABLE CHEST 1 VIEW COMPARISON:  Portable exam 1623 hours compared to 07/22/2011 FINDINGS: Normal heart size, mediastinal contours and pulmonary vascularity. Extensive BILATERAL upper lobe scarring and volume loss with superior retraction of the hila. Severe emphysematous changes. Overall appearance is unchanged since the previous exam. Minimal atherosclerotic calcification aortic arch. No pleural effusion or pneumothorax. Bones demineralized. IMPRESSION: Severe COPD changes with BILATERAL upper lobe scarring and volume loss. No acute abnormalities. Electronically Signed   By: Lavonia Dana M.D.   On: 01/26/2015 17:28   Dg Abd Portable 1v  01/26/2015  CLINICAL DATA:  74 year old male with a history of orogastric tube placement. EXAM: PORTABLE ABDOMEN - 1 VIEW COMPARISON:  01/26/2015 FINDINGS: Frontal view at the level the diaphragms demonstrates gastric tube terminating in the left upper  quadrant. The side port is at the level of the GE junction. Gas within small bowel and colon. No displaced fracture. IMPRESSION: Frontal view of the abdomen at the level of the epigastrium demonstrates gastric tube terminating in the left upper quadrant with the side port at the level of the GE junction. Signed, Dulcy Fanny. Earleen Newport, DO [facility] Radiology Electronically Signed   By: Corrie Mckusick D.O.   On: 01/26/2015 18:50       DISCUSSION: Chad Velazquez is a 74 y.o. male with severe COPD (FEV1 24% predicted in May 2016) who presents with acute on chronic hypoxic and hypercarbic respiratory failure in the setting of COPD exacerbation, likely from not taking his medications after running out. He had thick brown sputum from his ETT after intubation in the ED which is suspicious for infection.   ASSESSMENT / PLAN:  PULMONARY A: Acute hypoxic respiratory  failure in setting of advanced COPD with chronic hypercarbia - No focal consolidative processes on CXR aside from chronic pulmonary scarring/fibrosis. Patient may be having COPD exacerbation from infection versus lack of medications. He is now mechanically ventilated.s/p extubation 01/27/15   - still wheezing and subjectively not better 01/18/15  P:   - Methylprednisolone 60mg  q6h  - Antibiotics as below - Nebs q4h - start lasix empiric  - BiPAP Qh + on/off during day  CARDIOVASCULAR A:  NSTEMI, likely due to resp stress Shock, septic vs cardiogenic vs meds/sedation   - not on pressors. Demand ischemia per cards. Bleeding at CVL site / Has new epigastric pain P:  - Recycle troponin - DC  Heparin drip for ACS esp ith CVL bleeding - thrombin pad for CVL site - Aspirin given - await ECHO results  RENAL A:   Chronic hypercarbic respiratory acidosis - stable. No renal issues otherwise P:   Monitor  GASTROINTESTINAL A:   Having epigastric pain  P Dc npo Start po diet Gi cocktail x 1 porotnix po Recycle trop   HEMATOLOGIC A:   Leukocytosis secondary to COPD exacerbation  P:  Monitor  INFECTIOUS A:   COPD exacerbation - antibiotic therapy to cover possible CAP.  P:   Ceftriaxone and azithromycin started 12/17 pm Follow blood and resp cx's, narrow abx as cx , Pct and CXR indicate  NEUROLOGIC A:   Pain/Sedation bundle for mechanical ventilation  - resolved 01/27/15  P:   - Intermittent benzodiazepines -dc  fentanyl -- RASS target +1 to 0   FAMILY  - Updates: Wife updated by ED via telephone by Dr Verl Dicker 12/18 am. She has a tracheostomy and unable to vocalize per report. Updated patient 01/28/2015 at bedside in IDT fasion with RN at [place] family meet or Palliative Care meeting due by:  02/02/15    Dr. Brand Males, M.D., F.C.C.P Pulmonary and Critical Care Medicine Staff Physician [place] [facility]  [facility] Pager: [phone], If no answer or between  15:00h - 7:00h: call [phone]  01/28/2015 9:32 AM

## 2015-01-29 DIAGNOSIS — R000 Tachycardia, unspecified: Secondary | ICD-10-CM

## 2015-01-29 DIAGNOSIS — E43 Unspecified severe protein-calorie malnutrition: Secondary | ICD-10-CM | POA: Insufficient documentation

## 2015-01-29 LAB — CBC
HCT: 40.9 % (ref 39.0–52.0)
Hemoglobin: 13.8 g/dL (ref 13.0–17.0)
MCH: 29.2 pg (ref 26.0–34.0)
MCHC: 33.7 g/dL (ref 30.0–36.0)
MCV: 86.5 fL (ref 78.0–100.0)
PLATELETS: 235 10*3/uL (ref 150–400)
RBC: 4.73 MIL/uL (ref 4.22–5.81)
RDW: 14 % (ref 11.5–15.5)
WBC: 29.4 10*3/uL — AB (ref 4.0–10.5)

## 2015-01-29 LAB — BASIC METABOLIC PANEL
Anion gap: 9 (ref 5–15)
BUN: 32 mg/dL — ABNORMAL HIGH (ref 6–20)
CALCIUM: 8.9 mg/dL (ref 8.9–10.3)
CO2: 33 mmol/L — ABNORMAL HIGH (ref 22–32)
CREATININE: 1.03 mg/dL (ref 0.61–1.24)
Chloride: 92 mmol/L — ABNORMAL LOW (ref 101–111)
Glucose, Bld: 153 mg/dL — ABNORMAL HIGH (ref 65–99)
Potassium: 4.5 mmol/L (ref 3.5–5.1)
SODIUM: 134 mmol/L — AB (ref 135–145)

## 2015-01-29 LAB — MAGNESIUM: MAGNESIUM: 2.5 mg/dL — AB (ref 1.7–2.4)

## 2015-01-29 LAB — GLUCOSE, CAPILLARY: GLUCOSE-CAPILLARY: 126 mg/dL — AB (ref 65–99)

## 2015-01-29 LAB — TROPONIN I
TROPONIN I: 0.17 ng/mL — AB (ref <0.031)
TROPONIN I: 0.16 ng/mL — AB (ref <0.031)

## 2015-01-29 LAB — PHOSPHORUS: Phosphorus: 3.2 mg/dL (ref 2.5–4.6)

## 2015-01-29 MED ORDER — PANTOPRAZOLE SODIUM 40 MG PO TBEC
40.0000 mg | DELAYED_RELEASE_TABLET | Freq: Two times a day (BID) | ORAL | Status: DC
  Administered 2015-01-30 – 2015-02-01 (×6): 40 mg via ORAL
  Filled 2015-01-29 (×6): qty 1

## 2015-01-29 MED ORDER — DILTIAZEM HCL 30 MG PO TABS
30.0000 mg | ORAL_TABLET | Freq: Four times a day (QID) | ORAL | Status: DC
  Administered 2015-01-29 – 2015-02-01 (×14): 30 mg via ORAL
  Filled 2015-01-29 (×19): qty 1

## 2015-01-29 NOTE — Care Management Important Message (Signed)
Important Message  Patient Details  Name: Chad Velazquez MRN: [id number] Date of Birth: 23-Mar-1940   Medicare Important Message Given:  Yes    Nathen May 01/29/2015, 5:08 PM

## 2015-01-29 NOTE — Progress Notes (Signed)
Pt refuseing BIPAP. States he talked to MD and explained why he cant wear it

## 2015-01-29 NOTE — Progress Notes (Signed)
Report called and received from [facility], on 2S. Pt to be transferred to [place].

## 2015-01-29 NOTE — Progress Notes (Addendum)
NURSING PROGRESS NOTE  Chad Velazquez [id number] Transfer Data: 01/29/2015 7:54 PM Attending Provider: Brand Males, MD [id number], MD Code Status: Partial   Chad Velazquez is a 74 y.o. male patient transferred from [place]  -No acute distress noted.  -No complaints of shortness of breath.  -No complaints of chest pain.   Cardiac Monitoring: Box #   in place. Cardiac monitor yields:sinus tachycardia.  Last Documented Vital Signs: Blood pressure 153/87, pulse 118, temperature 97.9 F (36.6 C), temperature source Oral, resp. rate 24, height 5\' 8"  (1.727 m), weight 53 kg (116 lb 13.5 oz), SpO2 99 %.  IV Fluids:  IV in place, occlusive dsg intact without redness, IV cath antecubital right and, condition patent and no redness and left, condition patent and no redness none.   Allergies:  Review of patient's allergies indicates no known allergies.  Past Medical History:   has a past medical history of Emphysema; Abnormal chest CT; Pneumonia; and COPD (chronic obstructive pulmonary disease).  Past Surgical History:   has past surgical history that includes laparotomy.  Social History:   reports that he has been smoking Cigarettes.  He has a 59 pack-year smoking history. He has never used smokeless tobacco. He reports that he does not drink alcohol or use illicit drugs.  Skin: intact with bilateral bruising on arms  Patient/Family orientated to room. Information packet given to patient/family. Admission inpatient armband information verified with patient/family to include name and date of birth and placed on patient arm. Side rails up x 2, fall assessment and education completed with patient/family. Patient/family able to verbalize understanding of risk associated with falls and verbalized understanding to call for assistance before getting out of bed. Call light within reach. Patient/family able to voice and demonstrate understanding of unit orientation instructions.

## 2015-01-29 NOTE — Progress Notes (Addendum)
PULMONARY / CRITICAL CARE MEDICINE History and Physical  Name: Chad Velazquez MRN: [id number] DOB: 08-24-1940    ADMISSION DATE:  01/26/2015 CONSULTATION DATE:  01/26/15  REFERRING MD:  Dr Reather Converse, MD  CHIEF COMPLAINT:  Respiratory failure  BRIEF  Chad Velazquez is a 74 y.o. male with advanced COPD who presents with shortness of breath. Per the ED staff, the patient reported 4 days of worsening shortness of breath after running out of his inhaler therapies for COPD. We are uncertain if he had fevers, chills, URI symptoms or changes in his sputum production. He received Methylprednisolone en route by EMS. In the ED, he was found to be hypoxic and hypercarbic. After a trial of BiPAP, the patient requested intubation due to continued dyspnea. He was transiently hypotensive after intubation and sedation but responded to fluid boluses. A CXR was done and revealed hyperinflated lungs, chronic scarring but no focal consolidations which were new. He reportedly had chest pain as well with EKG having concerns for ST segment elevation in V3-5. Cardiology was called but believe this is secondary to stress from his respiratory status. A POC troponin was negative in the ED.   Interval events / Subjective:  Tolerating some PSV now.  Slight residual wheeze    SUBJECTIVE/OVERNIGHT/INTERVAL HX 12/20: O2 sat stable on 3L  [place]; reports mild sob/sobe, anorexia and epigastric pain which he believes is acid reflux. No further bleeding from CVL site.   01/28/15 - extubated yesterday. Used BiPAP qhs. Pulse ox 100% on 4L [place]. Reports wheezing and epigastric pain. Denies this is chest pain RN reports bleeding around CVL site - On IV heparin. Cards reports demand ischemia. ECHO pending. Overall he is only 30% near baaseline and only some better since admit   VITAL SIGNS: BP 121/74 mmHg  Pulse 127  Temp(Src) 97.9 F (36.6 C) (Oral)  Resp 26  Ht 5\' 8"  (1.727 m)  Wt 53 kg (116 lb 13.5 oz)  BMI 17.77 kg/m2  SpO2  96%  VENTILATOR SETTINGS:    INTAKE / OUTPUT: I/O last 3 completed shifts: In: 1333.3 [I.V.:433.3; IV [id number] Out: [id number] [Urine:4220]  PHYSICAL EXAMINATION: General:  Deconditioned male. Awake, Alert Neuro:  Calm. RASS +1. Moves all 4. Cogniition intact. CAM0-ICU neg for delirum HEENT:  Moist membranes,  normal sclera Cardiovascular:  Tachycardic, irregular rhythm, II/VI systolic murmur over LSB Lungs: Bilateral wheezing, rhonchi and mildly labored respirtaions (on q4h nebs + IV steroids) Abdomen:  Soft, nontender, nondistended Musculoskeletal:  No focal findings Skin:  No rash  LABS:  PULMONARY  Recent Labs Lab 01/26/15 1739 01/26/15 1931 01/26/15 2305 01/27/15 0420  PHART 7.266* 7.226* 7.294* 7.344*  PCO2ART 63.3* 58.0* 46.3* 43.4  PO2ART 169.0* 139.0* 135* 140*  HCO3 28.8* 24.1* 21.8 23.1  TCO2 31 26 23.2 24.4  O2SAT 99.0 99.0 98.6 98.8    CBC  Recent Labs Lab 01/27/15 0259 01/28/15 0535 01/29/15 0205  HGB 13.9 13.6 13.8  HCT 41.0 41.4 40.9  WBC 15.7* 29.6* 29.4*  PLT 233 202 235    COAGULATION  Recent Labs Lab 01/26/15 1701  INR 1.14    CARDIAC    Recent Labs Lab 01/27/15 0259 01/27/15 0922 01/28/15 1200 01/28/15 1938 01/29/15 0205  TROPONINI 0.71* 0.66* 0.37* 0.23* 0.17*  0.16*   No results for input(s): PROBNP in the last 168 hours.   CHEMISTRY  Recent Labs Lab 01/26/15 1701 01/26/15 2019 01/27/15 0259 01/28/15 0535 01/29/15 0205  NA 131* 134* 130* 133* 134*  K 4.3  3.5 4.4 4.9 4.5  CL 95* 97* 97* 94* 92*  CO2 27 25 24 30  33*  GLUCOSE 164* 181* 252* 99 153*  BUN 8 8 12 17  32*  CREATININE 0.80 1.16 1.14 0.87 1.03  CALCIUM 9.4 8.5* 7.9* 8.6* 8.9  MG  --   --  2.5* 2.4 2.5*  PHOS  --   --  3.4 2.8 3.2   Estimated Creatinine Clearance: 46.5 mL/min (by C-G formula based on Cr of 1.03).   LIVER  Recent Labs Lab 01/26/15 1701 01/26/15 2019  AST  --  36  ALT  --  21  ALKPHOS  --  69  BILITOT  --  1.2  PROT   --  6.4*  ALBUMIN  --  3.2*  INR 1.14  --      INFECTIOUS  Recent Labs Lab 01/27/15 0922 01/28/15 0540  LATICACIDVEN 1.3 1.3     ENDOCRINE CBG (last 3)   Recent Labs  01/26/15 2230 01/29/15 0756  GLUCAP 158* 126*         IMAGING x48h  - image(s) personally visualized  -   highlighted in bold No results found.     DISCUSSION: TASHA BUSSIERE is a 74 y.o. male with severe COPD (FEV1 24% predicted in May 2016) who presents with acute on chronic hypoxic and hypercarbic respiratory failure in the setting of COPD exacerbation, likely from not taking his medications after running out. He had thick brown sputum from his ETT after intubation in the ED which is suspicious for infection. Now extubated and stable on O2 3L [place].   ASSESSMENT / PLAN:  PULMONARY A: Acute hypoxic respiratory failure in setting of advanced COPD with chronic hypercarbia - No focal consolidative processes on CXR aside from chronic pulmonary scarring/fibrosis. Patient may be having COPD exacerbation from infection versus lack of medications. He is now s/p extubation 01/27/15. Still wheezing but much improved P:   - Methylprednisolone 60mg  q6h  - Antibiotics as below - Nebs q4h - start lasix empiric  - BiPAP Qh + on/off during day  CARDIOVASCULAR A:  NSTEMI, likely due to resp stress Shock, septic vs cardiogenic vs meds/sedation Not on pressors. Demand ischemia per cards. Bleeding at CVL site / Has new epigastric pain P:  - Troponin stable -  Aspirin daily - ECHO 12/19 EF 65-70%  RENAL A:   Chronic hypercarbic respiratory acidosis - stable. No renal issues otherwise P:   Monitor  GASTROINTESTINAL A:   Having epigastric pain  P Dc npo Start po diet PRN mylanta porotnix po BID  HEMATOLOGIC A:   Leukocytosis secondary to COPD exacerbation  P:  Monitor  INFECTIOUS A:   COPD exacerbation - antibiotic therapy to cover possible CAP.  P:   Ceftriaxone and azithromycin started  12/17 pm Follow blood and resp cx's, narrow abx as cx , Pct and CXR indicate  NEUROLOGIC A:   Pain/Sedation bundle for mechanical ventilation  - resolved 01/27/15  P:   - Intermittent benzodiazepines -dc  fentanyl -- RASS target +1 to 0   FAMILY  - Updates: Updated patient 01/29/2015 at bedside in IDT fasion with RN at [facility] family meet or Palliative Care meeting due by:  02/02/15    [facility] ANP-BC Pulmonary and [place] Pager: [phone]  01/29/2015 1:11 PM   STAFF NOTE: I, Dr Ann Lions have personally reviewed patient's available data, including medical history, events of note, physical examination and test  results as part of my evaluation. I have discussed with resident/NP and other care providers such as pharmacist, RN and RRT.  In addition,  I personally evaluated patient and elicited key findings of   S: feels better but still wheezing At time of MD rounds he suddenly expressed loss of hope and feklt his days are numbered. Says quality of lfie poor and he feels death is near. Said he wants to be at home an enjour remaining quality of life. Said he wants DNR, DNI. And consider hospice. RN Says unable to get out of bed without dyspnea  O: frail wheezing  A: Advanced COPD with acute on chronic resp failure - improved but worried ECOG 4 and might only improve despite intense rehab to ECOG 3  P:  move to floor DNR, DNI  Pall care consult for goals , symptoms and hospice evl   .  Rest per NP/medical resident whose note is outlined above and that I agree with   Dr. Brand Males, M.D., [facility]C.P Pulmonary and Critical Care Medicine Staff Physician [place] [facility] Pager: [phone], If no answer or between  15:00h - 7:00h: call [phone]  01/29/2015 7:36 PM

## 2015-01-29 NOTE — Plan of Care (Signed)
Problem: Consults Goal: Nutrition Consult-if indicated Outcome: Not Progressing Pt refusing to eat; drink 1/2 of an ensure  Problem: ICU Phase Progression Outcomes Goal: Voiding-avoid urinary catheter unless indicated Outcome: Not Progressing Pt has a coude catheter   Problem: Phase I Progression Outcomes Goal: O2 sats > or equal 90% or at baseline On 3L [place]

## 2015-01-29 NOTE — Progress Notes (Signed)
SUBJECTIVE:  Complains of "acid burning in  My stomach"  OBJECTIVE:   Vitals:   Filed Vitals:   01/29/15 0600 01/29/15 0700 01/29/15 0800 01/29/15 0801  BP: 119/80 141/80 126/90   Pulse: 125 125 124   Temp:    97.6 F (36.4 C)  TempSrc:    Oral  Resp: 22 22 24    Height:      Weight: 116 lb 13.5 oz (53 kg)     SpO2: 96% 95% 95%    I&O's:   Intake/Output Summary (Last 24 hours) at 01/29/15 [id number] Last data filed at 01/29/15 0600  Gross per 24 hour  Intake    400 ml  Output   3200 ml  Net  -2800 ml   TELEMETRY: Reviewed telemetry pt in sinus tachcyardia     PHYSICAL EXAM General: Well developed, well nourished, in no acute distress Head: Eyes PERRLA, No xanthomas.   Normal cephalic and atramatic  Lungs:   Diffuse wheezing Heart:   HRRR tachycardic S1 S2 Pulses are 2+ & equal. Abdomen: Bowel sounds are positive, abdomen soft and non-tender without masses  Extremities:   No clubbing, cyanosis or edema.  DP +1 Neuro: Alert and oriented X 3. Psych:  Good affect, responds appropriately   LABS: Basic Metabolic Panel:  Recent Labs  01/28/15 0535 01/29/15 0205  NA 133* 134*  K 4.9 4.5  CL 94* 92*  CO2 30 33*  GLUCOSE 99 153*  BUN 17 32*  CREATININE 0.87 1.03  CALCIUM 8.6* 8.9  MG 2.4 2.5*  PHOS 2.8 3.2   Liver Function Tests:  Recent Labs  01/26/15 2019  AST 36  ALT 21  ALKPHOS 69  BILITOT 1.2  PROT 6.4*  ALBUMIN 3.2*   No results for input(s): LIPASE, AMYLASE in the last 72 hours. CBC:  Recent Labs  01/26/15 1701  01/28/15 0535 01/29/15 0205  WBC 15.4*  < > 29.6* 29.4*  NEUTROABS 10.9*  --   --   --   HGB 16.6  < > 13.6 13.8  HCT 49.0  < > 41.4 40.9  MCV 86.4  < > 88.5 86.5  PLT 297  < > 202 235  < > = values in this interval not displayed. Cardiac Enzymes:  Recent Labs  01/28/15 1200 01/28/15 1938 01/29/15 0205  TROPONINI 0.37* 0.23* 0.17*  0.16*   BNP: Invalid input(s): POCBNP D-Dimer: No results for input(s): DDIMER in the  last 72 hours. Hemoglobin A1C: No results for input(s): HGBA1C in the last 72 hours. Fasting Lipid Panel:  Recent Labs  01/26/15 2018  TRIG 72   Thyroid Function Tests: No results for input(s): TSH, T4TOTAL, T3FREE, THYROIDAB in the last 72 hours.  Invalid input(s): FREET3 Anemia Panel: No results for input(s): VITAMINB12, FOLATE, FERRITIN, TIBC, IRON, RETICCTPCT in the last 72 hours. Coag Panel:   Lab Results  Component Value Date   INR 1.14 01/26/2015    RADIOLOGY: Dg Chest Port 1 View  01/27/2015  CLINICAL DATA:  Central line placement. EXAM: PORTABLE CHEST 1 VIEW COMPARISON:  Yesterday at 1826 hour FINDINGS: Tip of the right central line projects over the region of the mid SVC. No evidence of pneumothorax. Endotracheal tube at the thoracic inlet. Enteric tube in place, side-port in the region of the distal esophagus. Lungs remain hyperinflated with emphysema and biapical pleural parenchymal scarring. IMPRESSION: 1. Tip of the right central line in the mid SVC.  No pneumothorax. 2. Side port of the enteric tube remains  in the distal esophagus. Advancement recommended. 3. Hyperinflation with emphysema and biapical pleural parenchymal scarring. Electronically Signed   By: Jeb Levering M.D.   On: 01/27/2015 01:36   Dg Chest Portable 1 View  01/26/2015  CLINICAL DATA:  Endotracheal intubation. EXAM: PORTABLE CHEST 1 VIEW COMPARISON:  Chest x-rays from earlier same day and 06/28/2011. FINDINGS: Endotracheal tube appears well positioned with tip approximately 2 cm above the carina. Heart size is normal. Overall cardiomediastinal silhouette is stable in size and configuration compared to earlier exams. Nasogastric tube passes below the diaphragm. Extensive biapical scarring/ fibrosis again noted. Hyperexpanded lungs suggesting COPD. No new lung findings. IMPRESSION: 1. Endotracheal tube well positioned with tip just above the level of the carina. 2. Nasogastric tube with tip in the  stomach, however, proximal side holes are at the level of the gastroesophageal junction. Advancement of the NG tube is recommended. 3. Severe biapical scarring/fibrosis and presumed emphysema. No acute-appearing lung findings. Electronically Signed   By: Franki Cabot M.D.   On: 01/26/2015 18:52   Dg Chest Portable 1 View  01/26/2015  CLINICAL DATA:  Respiratory distress, COPD exacerbation, chest pain with cough for 4 days EXAM: PORTABLE CHEST 1 VIEW COMPARISON:  Portable exam 1623 hours compared to 07/22/2011 FINDINGS: Normal heart size, mediastinal contours and pulmonary vascularity. Extensive BILATERAL upper lobe scarring and volume loss with superior retraction of the hila. Severe emphysematous changes. Overall appearance is unchanged since the previous exam. Minimal atherosclerotic calcification aortic arch. No pleural effusion or pneumothorax. Bones demineralized. IMPRESSION: Severe COPD changes with BILATERAL upper lobe scarring and volume loss. No acute abnormalities. Electronically Signed   By: Lavonia Dana M.D.   On: 01/26/2015 17:28   Dg Abd Portable 1v  01/26/2015  CLINICAL DATA:  74 year old male with a history of orogastric tube placement. EXAM: PORTABLE ABDOMEN - 1 VIEW COMPARISON:  01/26/2015 FINDINGS: Frontal view at the level the diaphragms demonstrates gastric tube terminating in the left upper quadrant. The side port is at the level of the GE junction. Gas within small bowel and colon. No displaced fracture. IMPRESSION: Frontal view of the abdomen at the level of the epigastrium demonstrates gastric tube terminating in the left upper quadrant with the side port at the level of the GE junction. Signed, Dulcy Fanny. Earleen Newport, DO [facility] Electronically Signed   By: Corrie Mckusick D.O.   On: 01/26/2015 18:50    ASSESSMENT AND PLAN:  ELEVATED TROPONIN: The troponin trend is consistent with demand ischemia from acute respiratory  failure/shock . He does not seem to be indicating chest pain. 2D echo with normal LVF.No further cardiac intervention indicated at this time. Troponin elevation with flat trend. Agree with ASA.  Recommend outpt nuclear stress test once COPD and PNA have resolved.    COPD EXACERBATION with possible PNA - per CCM  SINUS TACHYCARDIA - most likely due to underlying COPD exacerbation but this has been persistent for several days.  Cannot use BB due to COPD.  Will add Cardizem 30mg  q6 hours.  Sueanne Margarita, MD  01/29/2015  9:23 AM

## 2015-01-30 DIAGNOSIS — R0602 Shortness of breath: Secondary | ICD-10-CM

## 2015-01-30 DIAGNOSIS — Z515 Encounter for palliative care: Secondary | ICD-10-CM

## 2015-01-30 DIAGNOSIS — E43 Unspecified severe protein-calorie malnutrition: Secondary | ICD-10-CM

## 2015-01-30 LAB — CULTURE, RESPIRATORY

## 2015-01-30 LAB — BASIC METABOLIC PANEL
ANION GAP: 8 (ref 5–15)
BUN: 37 mg/dL — ABNORMAL HIGH (ref 6–20)
CO2: 35 mmol/L — ABNORMAL HIGH (ref 22–32)
Calcium: 9.1 mg/dL (ref 8.9–10.3)
Chloride: 89 mmol/L — ABNORMAL LOW (ref 101–111)
Creatinine, Ser: 0.96 mg/dL (ref 0.61–1.24)
GLUCOSE: 125 mg/dL — AB (ref 65–99)
POTASSIUM: 5.1 mmol/L (ref 3.5–5.1)
SODIUM: 132 mmol/L — AB (ref 135–145)

## 2015-01-30 LAB — CBC
HEMATOCRIT: 42.5 % (ref 39.0–52.0)
HEMOGLOBIN: 14.4 g/dL (ref 13.0–17.0)
MCH: 29.7 pg (ref 26.0–34.0)
MCHC: 33.9 g/dL (ref 30.0–36.0)
MCV: 87.6 fL (ref 78.0–100.0)
Platelets: 234 10*3/uL (ref 150–400)
RBC: 4.85 MIL/uL (ref 4.22–5.81)
RDW: 13.9 % (ref 11.5–15.5)
WBC: 27.5 10*3/uL — ABNORMAL HIGH (ref 4.0–10.5)

## 2015-01-30 LAB — MAGNESIUM: Magnesium: 2.9 mg/dL — ABNORMAL HIGH (ref 1.7–2.4)

## 2015-01-30 LAB — CULTURE, RESPIRATORY W GRAM STAIN

## 2015-01-30 LAB — PHOSPHORUS: PHOSPHORUS: 3.3 mg/dL (ref 2.5–4.6)

## 2015-01-30 MED ORDER — PREDNISONE 20 MG PO TABS: 40.0000 mg | ORAL_TABLET | Freq: Every day | ORAL | Status: DC

## 2015-01-30 MED ORDER — MORPHINE SULFATE (CONCENTRATE) 10 MG/0.5ML PO SOLN
5.0000 mg | Freq: Once | ORAL | Status: AC
  Administered 2015-01-30: 5 mg via ORAL
  Filled 2015-01-30: qty 0.5

## 2015-01-30 MED ORDER — ZOLPIDEM TARTRATE 5 MG PO TABS
2.5000 mg | ORAL_TABLET | Freq: Once | ORAL | Status: AC
  Administered 2015-01-30: 2.5 mg via ORAL
  Filled 2015-01-30: qty 1

## 2015-01-30 MED ORDER — DOXYCYCLINE HYCLATE 100 MG PO TABS
100.0000 mg | ORAL_TABLET | Freq: Two times a day (BID) | ORAL | Status: DC
  Administered 2015-01-30 – 2015-02-01 (×5): 100 mg via ORAL
  Filled 2015-01-30 (×7): qty 1

## 2015-01-30 MED ORDER — MORPHINE SULFATE 10 MG/5ML PO SOLN: 5.0000 mg | Freq: Once | ORAL | Status: DC

## 2015-01-30 MED ORDER — FUROSEMIDE 10 MG/ML IJ SOLN
20.0000 mg | Freq: Every day | INTRAMUSCULAR | Status: DC
  Administered 2015-01-31 – 2015-02-01 (×2): 20 mg via INTRAVENOUS
  Filled 2015-01-30 (×2): qty 2

## 2015-01-30 MED ORDER — BOOST / RESOURCE BREEZE PO LIQD
1.0000 | Freq: Three times a day (TID) | ORAL | Status: DC
  Administered 2015-01-30 – 2015-01-31 (×2): 1 via ORAL

## 2015-01-30 MED ORDER — TEMAZEPAM 7.5 MG PO CAPS
7.5000 mg | ORAL_CAPSULE | Freq: Every day | ORAL | Status: DC
  Administered 2015-01-30 – 2015-01-31 (×2): 7.5 mg via ORAL
  Filled 2015-01-30 (×2): qty 1

## 2015-01-30 MED ORDER — PREDNISONE 10 MG PO TABS
10.0000 mg | ORAL_TABLET | Freq: Every day | ORAL | Status: DC
Start: 2015-01-30 — End: 2015-02-01
  Administered 2015-01-30 – 2015-02-01 (×3): 10 mg via ORAL
  Filled 2015-01-30 (×3): qty 1

## 2015-01-30 NOTE — Progress Notes (Signed)
SUBJECTIVE:  No complaints  OBJECTIVE:   Vitals:   Filed Vitals:   01/29/15 1854 01/29/15 2121 01/30/15 0558 01/30/15 0833  BP: 153/87 120/66 132/65   Pulse:  109 90   Temp:  98.1 F (36.7 C) 99.1 F (37.3 C)   TempSrc:  Oral Axillary   Resp:  18 18   Height:      Weight:   125 lb 10.6 oz (57 kg)   SpO2:  93% 96% 96%   I&O's:   Intake/Output Summary (Last 24 hours) at 01/30/15 0955 Last data filed at 01/30/15 [id number]  Gross per 24 hour  Intake    480 ml  Output   1525 ml  Net  -1045 ml   TELEMETRY: Reviewed telemetry pt in NSR:     PHYSICAL EXAM General: Well developed, well nourished, in no acute distress Head: Eyes PERRLA, No xanthomas.   Normal cephalic and atramatic  Lungs:   Diffuse rhonchi and wheezes Heart:   HRRR S1 S2 Pulses are 2+ & equal. Abdomen: Bowel sounds are positive, abdomen soft and non-tender without masses Extremities:   No clubbing, cyanosis or edema.  DP +1 Neuro: Alert and oriented X 3. Psych:  Good affect, responds appropriately   LABS: Basic Metabolic Panel:  Recent Labs  01/29/15 0205 01/30/15 0530  NA 134* 132*  K 4.5 5.1  CL 92* 89*  CO2 33* 35*  GLUCOSE 153* 125*  BUN 32* 37*  CREATININE 1.03 0.96  CALCIUM 8.9 9.1  MG 2.5* 2.9*  PHOS 3.2 3.3   Liver Function Tests: No results for input(s): AST, ALT, ALKPHOS, BILITOT, PROT, ALBUMIN in the last 72 hours. No results for input(s): LIPASE, AMYLASE in the last 72 hours. CBC:  Recent Labs  01/29/15 0205 01/30/15 0530  WBC 29.4* 27.5*  HGB 13.8 14.4  HCT 40.9 42.5  MCV 86.5 87.6  PLT 235 234   Cardiac Enzymes:  Recent Labs  01/28/15 1200 01/28/15 1938 01/29/15 0205  TROPONINI 0.37* 0.23* 0.17*  0.16*   BNP: Invalid input(s): POCBNP D-Dimer: No results for input(s): DDIMER in the last 72 hours. Hemoglobin A1C: No results for input(s): HGBA1C in the last 72 hours. Fasting Lipid Panel: No results for input(s): CHOL, HDL, LDLCALC, TRIG, CHOLHDL, LDLDIRECT in  the last 72 hours. Thyroid Function Tests: No results for input(s): TSH, T4TOTAL, T3FREE, THYROIDAB in the last 72 hours.  Invalid input(s): FREET3 Anemia Panel: No results for input(s): VITAMINB12, FOLATE, FERRITIN, TIBC, IRON, RETICCTPCT in the last 72 hours. Coag Panel:   Lab Results  Component Value Date   INR 1.14 01/26/2015    RADIOLOGY: Dg Chest Port 1 View  01/27/2015  CLINICAL DATA:  Central line placement. EXAM: PORTABLE CHEST 1 VIEW COMPARISON:  Yesterday at 1826 hour FINDINGS: Tip of the right central line projects over the region of the mid SVC. No evidence of pneumothorax. Endotracheal tube at the thoracic inlet. Enteric tube in place, side-port in the region of the distal esophagus. Lungs remain hyperinflated with emphysema and biapical pleural parenchymal scarring. IMPRESSION: 1. Tip of the right central line in the mid SVC.  No pneumothorax. 2. Side port of the enteric tube remains in the distal esophagus. Advancement recommended. 3. Hyperinflation with emphysema and biapical pleural parenchymal scarring. Electronically Signed   By: Jeb Levering M.D.   On: 01/27/2015 01:36   Dg Chest Portable 1 View  01/26/2015  CLINICAL DATA:  Endotracheal intubation. EXAM: PORTABLE CHEST 1 VIEW COMPARISON:  Chest x-rays from  earlier same day and 06/28/2011. FINDINGS: Endotracheal tube appears well positioned with tip approximately 2 cm above the carina. Heart size is normal. Overall cardiomediastinal silhouette is stable in size and configuration compared to earlier exams. Nasogastric tube passes below the diaphragm. Extensive biapical scarring/ fibrosis again noted. Hyperexpanded lungs suggesting COPD. No new lung findings. IMPRESSION: 1. Endotracheal tube well positioned with tip just above the level of the carina. 2. Nasogastric tube with tip in the stomach, however, proximal side holes are at the level of the gastroesophageal junction. Advancement of the NG tube is recommended. 3. Severe  biapical scarring/fibrosis and presumed emphysema. No acute-appearing lung findings. Electronically Signed   By: Franki Cabot M.D.   On: 01/26/2015 18:52   Dg Chest Portable 1 View  01/26/2015  CLINICAL DATA:  Respiratory distress, COPD exacerbation, chest pain with cough for 4 days EXAM: PORTABLE CHEST 1 VIEW COMPARISON:  Portable exam 1623 hours compared to 07/22/2011 FINDINGS: Normal heart size, mediastinal contours and pulmonary vascularity. Extensive BILATERAL upper lobe scarring and volume loss with superior retraction of the hila. Severe emphysematous changes. Overall appearance is unchanged since the previous exam. Minimal atherosclerotic calcification aortic arch. No pleural effusion or pneumothorax. Bones demineralized. IMPRESSION: Severe COPD changes with BILATERAL upper lobe scarring and volume loss. No acute abnormalities. Electronically Signed   By: Lavonia Dana M.D.   On: 01/26/2015 17:28   Dg Abd Portable 1v  01/26/2015  CLINICAL DATA:  74 year old male with a history of orogastric tube placement. EXAM: PORTABLE ABDOMEN - 1 VIEW COMPARISON:  01/26/2015 FINDINGS: Frontal view at the level the diaphragms demonstrates gastric tube terminating in the left upper quadrant. The side port is at the level of the GE junction. Gas within small bowel and colon. No displaced fracture. IMPRESSION: Frontal view of the abdomen at the level of the epigastrium demonstrates gastric tube terminating in the left upper quadrant with the side port at the level of the GE junction. Signed, Dulcy Fanny. Earleen Newport, DO [facility] Electronically Signed   By: Corrie Mckusick D.O.   On: 01/26/2015 18:50     ASSESSMENT AND PLAN:  ELEVATED TROPONIN: The troponin trend is consistent with demand ischemia from acute respiratory failure/shock . He does not seem to be indicating chest pain. 2D echo with normal LVF.No further cardiac intervention indicated at this time.  Troponin elevation with flat trend. Agree with ASA. Recommend outpt nuclear stress test once COPD and PNA have resolved.   COPD EXACERBATION with possible PNA - per CCM  SINUS TACHYCARDIA - most likely due to underlying COPD exacerbation but this has been persistent for several days. Cannot use BB due to COPD. HR now controlled on Cardizem.  No new recs.  Will need followup with Cardiology outpt to set up nuclear stress test.  Will sign off.  Call with any questions.  Sueanne Margarita, MD  01/30/2015  9:55 AM

## 2015-01-30 NOTE — Progress Notes (Signed)
Dr. Chase Caller prescribed Morphine 5mg  (0.5ml) oral solution to help with Pt difficulty breathing. Reassessment Pt stated that the morphine had helped a little bit. Dr. Chase Caller paged about reassessment. MD called back and he stated during phone call that Pt could have morphine again around 6:00pm. Per MD Pt can also have Restoril tonight as well as Xanax if Pt needs. Pt currently resting calmly in bed.

## 2015-01-30 NOTE — Progress Notes (Signed)
   01/30/15 1300  Clinical Encounter Type  Visited With Patient;Health care provider  Visit Type Initial  Referral From Palliative care team  Consult/Referral To Chaplain  Spiritual Encounters  Spiritual Needs Emotional  [facility] responded to consult; pt talking with palliative care team; [facility] introduced himself and offered to visit at later time; [facility] will attempt to follow up. Gwynn Burly 1:23 PM

## 2015-01-30 NOTE — Progress Notes (Signed)
eLink Physician-Brief Progress Note Patient Name: Chad Velazquez DOB: 06-Sep-1940 MRN: [id number]   Date of Service  01/30/2015  HPI/Events of Note  Patient requests Ambien for sleep.  eICU Interventions  Will order Ambien 2.5 mg PO X 1 now.     Intervention Category Minor Interventions: Routine modifications to care plan (e.g. PRN medications for pain, fever)  Sommer,Steven Eugene 01/30/2015, 1:02 AM

## 2015-01-30 NOTE — Consult Note (Signed)
Consultation Note Date: 01/30/2015   Patient Name: Chad Velazquez  DOB: Jan 31, 1941  MRN: [id number]  Age / Sex: 74 y.o., male  PCP: Tamsen Roers, MD Referring Physician: Brand Males, MD  Reason for Consultation: Establishing goals of care    Clinical Assessment/Narrative: I met today with Timmothy Sours and we had a very pleasant conversation. He tells me that he has been extremely short of breath at home and even difficult for him to get around the house at times. Has little energy. He says that the quality of his life has greatly diminished lately. Then he had 4-5 days of excessive shortness of breath - "I was panting" but his wife recently had laryngectomy and he was concerned about financial repercussions of obtaining help. He did come to the hospital and was intubated in ICU. Timmothy Sours says that he feels like he is reaching the end of his life and his views on life and death come from a very rationale way of thinking as he tells me he tries to think of things in terms of physics and chemistry. He tells me that he believes death is like turning the off switch on a computer - once switched off that is final and absolute and all cease to exist. He says that he feels like his is walking down the hall to turn off his switch and he does not see the need to have to suffer at the end of his life. He has no regrets in life and says "I think I have lived life as a good man."   He served in the [employer] and enjoyed the brief visit with chaplain discussing this. We discuss hospice and he desires to have their assistance at home as his main goal at this time is that he does not want to suffer. He does not want to hasten death but he says he does not fear this and is accepting that this is his next step. He had good eye contact and pleasant conversation with appropriate responses and even smiled and joked at times - I do not see that he is severely  depressed but situational as I do feel that he suffers from poor QOL related to his advancing COPD. He agrees with hospice and tells me that he will leave this in my hands. I provided him reassurance and emotional support and listening. I will follow up tomorrow to see how symptoms are managed.   Contacts/Participants in Discussion: Primary Decision Maker: Self   Relationship to Patient Next would be Burman Nieves his wife - he says she will not have any problems with this plan and he will talk with her about this   SUMMARY OF RECOMMENDATIONS  Code Status/Advance Care Planning: DNR    Symptom Management:   SOB/pain: Agree with CCM about trial of morphine. Roxanol 5 mg every hour prn (may increase dosage as needed to be effective). Might benefit from scheduled dosing - will assess his need and usage. He appears quite comfortable other than brief coughing episodes at time of my visit. He may also need this prior to activity.   Sleep: Agree with CCM Restoril - will assess efficacy for him tomorrow.   Anxiety: Continue Xanax prn - may consider scheduled dosing with worsening.   Bowel Regimen: Senokot-S qhs.   Palliative Prophylaxis:   Bowel Regimen, Delirium Protocol and Oral Care  Additional Recommendations (Limitations, Scope, Preferences):  Full Comfort Care  Psycho-social/Spiritual:  Support System: Poor - mainly just he and his wife who has  her own health issues. Will benefit from max support from hospice care.  Desire for further Chaplaincy support:yes Additional Recommendations: Caregiving  Support/Resources and Education on Hospice  Prognosis: < 6 months  Discharge Planning: Home with Hospice   Chief Complaint/ Primary Diagnoses: Present on Admission:  . COPD exacerbation ([place]) . Acute respiratory failure with hypoxia ([place]) . Elevated troponin  I have reviewed the medical record, interviewed the patient and family, and examined the patient. The following aspects are  pertinent.  Past Medical History  Diagnosis Date  . Emphysema   . Abnormal chest CT   . Pneumonia   . COPD (chronic obstructive pulmonary disease)    Social History   Social History  . Marital Status: Married    Spouse Name: N/A  . Number of Children: N/A  . Years of Education: N/A   Occupational History  . Public Accountant    Social History Main Topics  . Smoking status: Current Some Day Smoker -- 1.00 packs/day for 59 years    Types: Cigarettes  . Smokeless tobacco: Never Used     Comment: occas will still smoke  . Alcohol Use: No  . Drug Use: No  . Sexual Activity: Not on file   Other Topics Concern  . Not on file   Social History Narrative   Family History  Problem Relation Age of Onset  . Adopted: Yes   Scheduled Meds: . aspirin  81 mg Oral Daily  . diltiazem  30 mg Oral 4 times per day  . doxycycline  100 mg Oral Q12H  . feeding supplement (ENSURE ENLIVE)  237 mL Oral TID BM  . [START ON 01/31/2015] furosemide  20 mg Intravenous Daily  . levalbuterol  0.63 mg Nebulization QID  . morphine CONCENTRATE  5 mg Oral Once  . pantoprazole  40 mg Oral BID AC  . predniSONE  10 mg Oral Q breakfast  . temazepam  7.5 mg Oral QHS   Continuous Infusions:  PRN Meds:.albuterol, ALPRAZolam, alum & mag hydroxide-simeth, docusate sodium, ondansetron (ZOFRAN) IV Medications Prior to Admission:  Prior to Admission medications   Medication Sig Start Date End Date Taking? Authorizing Provider  budesonide-formoterol (SYMBICORT) 160-4.5 MCG/ACT inhaler Inhale 2 puffs into the lungs 2 (two) times daily. 01/21/15  Yes Brand Males, MD  predniSONE (DELTASONE) 10 MG tablet Take 0.5 tablets (5 mg total) by mouth daily. 08/22/14  Yes Brand Males, MD  SPIRIVA HANDIHALER 18 MCG inhalation capsule INHALE CONTENTS OF 1 CAPSULE BY MOUTH ONCE DAILY VIA HANDIHALER 01/07/15  Yes Tammy S Parrett, NP  VENTOLIN HFA 108 (90 BASE) MCG/ACT inhaler INHALE TWO PUFFS BY MOUTH EVERY 6 HOURS AS  NEEDED FOR WHEEZING 01/21/15  Yes Brand Males, MD  ALPRAZolam Duanne Moron) 0.5 MG tablet Take 0.5 mg by mouth 3 (three) times daily.    Historical Provider, MD  B Complex Vitamins (VITAMIN-B COMPLEX) TABS Take by mouth.    Historical Provider, MD  ibuprofen (ADVIL,MOTRIN) 200 MG tablet Take 200 mg by mouth every 6 (six) hours as needed. For pain    Historical Provider, MD  ipratropium-albuterol (DUONEB) 0.5-2.5 (3) MG/3ML SOLN Take 3 mLs by nebulization every 4 (four) hours as needed (SOB, WHEEZING, COUGHING). 07/01/11   Clanford Marisa Hua, MD  Multiple Vitamin (MULITIVITAMIN WITH MINERALS) TABS Take 1 tablet by mouth daily.    Historical Provider, MD   No Known Allergies  Review of Systems  Constitutional: Positive for activity change and appetite change.  Respiratory: Positive for shortness of  breath.   Neurological: Positive for weakness.  Psychiatric/Behavioral: Positive for sleep disturbance.    Physical Exam  Constitutional: He is oriented to person, place, and time. He appears well-developed.  Cardiovascular: Normal rate.   Respiratory: Effort normal. No accessory muscle usage. No tachypnea. No respiratory distress.  + cough  GI: Normal appearance.  Neurological: He is alert and oriented to person, place, and time.  Psychiatric: He has a normal mood and affect.    Vital Signs: BP 128/73 mmHg  Pulse 90  Temp(Src) 99.1 F (37.3 C) (Axillary)  Resp 18  Ht 5' 8"  (1.727 m)  Wt 57 kg (125 lb 10.6 oz)  BMI 19.11 kg/m2  SpO2 98%  SpO2: SpO2: 98 % O2 Device:SpO2: 98 % O2 Flow Rate: .O2 Flow Rate (L/min): 4 L/min  IO: Intake/output summary:  Intake/Output Summary (Last 24 hours) at 01/30/15 1412 Last data filed at 01/30/15 1046  Gross per 24 hour  Intake    480 ml  Output   1675 ml  Net  -1195 ml    LBM:   Baseline Weight: Weight: 52.164 kg (115 lb) Most recent weight: Weight: 57 kg (125 lb 10.6 oz)      Palliative Assessment/Data:  Flowsheet Rows        Most Recent  Value   Intake Tab    Referral Department  Critical care   Unit at Time of Referral  ICU   Palliative Care Primary Diagnosis  Pulmonary   Date Notified  01/29/15   Palliative Care Type  New Palliative care   Reason for referral  Clarify Goals of Care   Date of Admission  01/26/15   # of days IP prior to Palliative referral  3   Clinical Assessment    Psychosocial & Spiritual Assessment    Palliative Care Outcomes       Additional Data Reviewed:  CBC:    Component Value Date/Time   WBC 27.5* 01/30/2015 0530   HGB 14.4 01/30/2015 0530   HCT 42.5 01/30/2015 0530   PLT 234 01/30/2015 0530   MCV 87.6 01/30/2015 0530   NEUTROABS 10.9* 01/26/2015 1701   LYMPHSABS 2.9 01/26/2015 1701   MONOABS 1.0 01/26/2015 1701   EOSABS 0.6 01/26/2015 1701   BASOSABS 0.0 01/26/2015 1701   Comprehensive Metabolic Panel:    Component Value Date/Time   NA 132* 01/30/2015 0530   K 5.1 01/30/2015 0530   CL 89* 01/30/2015 0530   CO2 35* 01/30/2015 0530   BUN 37* 01/30/2015 0530   CREATININE 0.96 01/30/2015 0530   GLUCOSE 125* 01/30/2015 0530   CALCIUM 9.1 01/30/2015 0530   AST 36 01/26/2015 2019   ALT 21 01/26/2015 2019   ALKPHOS 69 01/26/2015 2019   BILITOT 1.2 01/26/2015 2019   PROT 6.4* 01/26/2015 2019   ALBUMIN 3.2* 01/26/2015 2019     Time In: 1300 Time Out: 1415 Time Total: 78mn Greater than 50%  of this time was spent counseling and coordinating care related to the above assessment and plan.  Signed by: PPershing Proud NP  APershing Proud NP  [id number] 2:12 PM  Please contact Palliative Medicine Team phone at [phone] questions and concerns.

## 2015-01-30 NOTE — Progress Notes (Signed)
Nutrition Follow-up  DOCUMENTATION CODES:   Severe malnutrition in context of chronic illness, Underweight  INTERVENTION:   -D/c Ensure Enlive due to poor acceptance -Boost Breeze po TID, each supplement provides 250 kcal and 9 grams of protein  NUTRITION DIAGNOSIS:   Malnutrition related to chronic illness as evidenced by severe depletion of body fat, severe depletion of muscle mass.  Ongoing  GOAL:   Patient will meet greater than or equal to 90% of their needs  Unmet  MONITOR:   PO intake, Supplement acceptance, Weight trends, Labs, I & O's, Skin  REASON FOR ASSESSMENT:   Consult Assessment of nutrition requirement/status  ASSESSMENT:   74 y.o. male with advanced COPD who presents with shortness of breath. Per the ED staff, the patient reported 4 days of worsening shortness of breath after running out of his inhaler therapies for COPD. CXR was done and revealed hyperinflated lungs, chronic scarring but no focal consolidations   Pt transferred from ICU to medical floor on 01/29/15.   Pt's appetite continues to be poor. Meal completion 0% per doc floswheet records. Ensure Enlive was ordered on 01/28/15, however, poor acceptance per [facility]. Pt states the consistency is "too thick" and "makes me gag". Will d/c due to poor acceptance.   Palliative care consult pending for goals of care.   Diet Order:  Diet heart healthy/carb modified Room service appropriate?: Yes; Fluid consistency:: Thin  Skin:  Wound (see comment) (Skin tear L wrist)  Last BM:  Unknown  Height:   Ht Readings from Last 1 Encounters:  01/26/15 5\' 8"  (1.727 m)    Weight:   Wt Readings from Last 1 Encounters:  01/30/15 125 lb 10.6 oz (57 kg)    Ideal Body Weight:  70 kg  BMI:  Body mass index is 19.11 kg/(m^2).  Estimated Nutritional Needs:   Kcal:  [id number]  Protein:  80-90 grams  Fluid:  1.7 - 1.9 L/day  EDUCATION NEEDS:   No education needs identified at this time  Ichiro Chesnut A.  Jimmye Norman, RD, LDN, CDE Pager: [phone] After hours Pager: [phone]

## 2015-01-30 NOTE — Progress Notes (Signed)
PULMONARY / CRITICAL CARE MEDICINE History and Physical  Name: Chad Velazquez MRN: [id number] DOB: Oct 19, 1940    ADMISSION DATE:  01/26/2015 CONSULTATION DATE:  01/26/15  REFERRING MD:  Dr Reather Converse, MD  CHIEF COMPLAINT:  Respiratory failure  BRIEF  Chad Velazquez is a 74 y.o. male with advanced COPD who presents with shortness of breath. Per the ED staff, the patient reported 4 days of worsening shortness of breath after running out of his inhaler therapies for COPD. We are uncertain if he had fevers, chills, URI symptoms or changes in his sputum production. He received Methylprednisolone en route by EMS. In the ED, he was found to be hypoxic and hypercarbic. After a trial of BiPAP, the patient requested intubation due to continued dyspnea. He was transiently hypotensive after intubation and sedation but responded to fluid boluses. A CXR was done and revealed hyperinflated lungs, chronic scarring but no focal consolidations which were new. He reportedly had chest pain as well with EKG having concerns for ST segment elevation in V3-5. Cardiology was called but believe this is secondary to stress from his respiratory status. A POC troponin was negative in the ED.   Interval events / Subjective:  12/18 Tolerating some PSV now.  Slight residual wheeze  01/28/15 - extubated yesterday. Used BiPAP qhs. Pulse ox 100% on 4L [place]. Reports wheezing and epigastric pain. Denies this is chest pain RN reports bleeding around CVL site - On IV heparin. Cards reports demand ischemia. ECHO pending. Overall he is only 30% near baaseline and only some better since admit   12/20: O2 sat stable on 3L  [place]; reports mild sob/sobe, anorexia and epigastric pain which he believes is acid reflux. No further bleeding from CVL site.    SUBJECTIVE/OVERNIGHT/INTERVAL HX 01/30/15- asking for symptom oriented care. Thinks he would benefit from hospice. Thinks end is near. Says ambien lst night helped dyspnea and insomnia.  No  appetite. Says not to dc him home soon because he has no social support - wife is s/p layrngectomy.  RN asking PT consult  VITAL SIGNS: BP 132/65 mmHg  Pulse 90  Temp(Src) 99.1 F (37.3 C) (Axillary)  Resp 18  Ht 5' 8"  (1.727 m)  Wt 57 kg (125 lb 10.6 oz)  BMI 19.11 kg/m2  SpO2 96%  VENTILATOR SETTINGS:    INTAKE / OUTPUT: I/O last 3 completed shifts: In: 1120 [P.O.:420; I.V.:100; IV Piggyback:600] Out: 2975 [Urine:2975]  PHYSICAL EXAMINATION: General:  Deconditioned male. Awake, Alert Psych: ? depresed - unlear Neuro:  Calm. RASS +1. Moves all 4. Cogniition intact. CAM0-ICU neg for delirum HEENT:  Moist membranes,  normal sclera Cardiovascular:  Tachycardic, irregular rhythm, II/VI systolic murmur over LSB Lungs: Bilateral wheezing, rhonchi and mildly labored respirtaions (on q4h nebs + IV steroids) Abdomen:  Soft, nontender, nondistended Musculoskeletal:  No focal findings Skin:  No rash  LABS:  PULMONARY  Recent Labs Lab 01/26/15 1739 01/26/15 1931 01/26/15 2305 01/27/15 0420  PHART 7.266* 7.226* 7.294* 7.344*  PCO2ART 63.3* 58.0* 46.3* 43.4  PO2ART 169.0* 139.0* 135* 140*  HCO3 28.8* 24.1* 21.8 23.1  TCO2 31 26 23.2 24.4  O2SAT 99.0 99.0 98.6 98.8    CBC  Recent Labs Lab 01/28/15 0535 01/29/15 0205 01/30/15 0530  HGB 13.6 13.8 14.4  HCT 41.4 40.9 42.5  WBC 29.6* 29.4* 27.5*  PLT 202 235 234    COAGULATION  Recent Labs Lab 01/26/15 1701  INR 1.14    CARDIAC    Recent Labs Lab 01/27/15  0259 01/27/15 0922 01/28/15 1200 01/28/15 1938 01/29/15 0205  TROPONINI 0.71* 0.66* 0.37* 0.23* 0.17*  0.16*   No results for input(s): PROBNP in the last 168 hours.   CHEMISTRY  Recent Labs Lab 01/26/15 2019 01/27/15 0259 01/28/15 0535 01/29/15 0205 01/30/15 0530  NA 134* 130* 133* 134* 132*  K 3.5 4.4 4.9 4.5 5.1  CL 97* 97* 94* 92* 89*  CO2 25 24 30  33* 35*  GLUCOSE 181* 252* 99 153* 125*  BUN 8 12 17  32* 37*  CREATININE 1.16  1.14 0.87 1.03 0.96  CALCIUM 8.5* 7.9* 8.6* 8.9 9.1  MG  --  2.5* 2.4 2.5* 2.9*  PHOS  --  3.4 2.8 3.2 3.3   Estimated Creatinine Clearance: 53.6 mL/min (by C-G formula based on Cr of 0.96).   LIVER  Recent Labs Lab 01/26/15 1701 01/26/15 2019  AST  --  36  ALT  --  21  ALKPHOS  --  69  BILITOT  --  1.2  PROT  --  6.4*  ALBUMIN  --  3.2*  INR 1.14  --      INFECTIOUS  Recent Labs Lab 01/27/15 0922 01/28/15 0540  LATICACIDVEN 1.3 1.3     ENDOCRINE CBG (last 3)   Recent Labs  01/29/15 0756  GLUCAP 126*         IMAGING x48h  - image(s) personally visualized  -   highlighted in bold No results found.     DISCUSSION: Chad Velazquez is a 74 y.o. male with severe COPD (FEV1 24% predicted in May 2016) who presents with acute on chronic hypoxic and hypercarbic respiratory failure in the setting of COPD exacerbation    ASSESSMENT / PLAN:  PULMONARY A: Acute hypoxic respiratory failure in setting of advanced COPD with chronic hypercarbia -  COPD exacerbation from MSSSA infection versus lack of medications. He is now s/p extubation 01/27/15. Still wheezing but much improved. Bu very deconditioned. HAVING CLASS 4 dyspnea despite all Rx   P:   - Methylprednisolone 61m q6h  -> chagne to prednisone - Antibiotics as below - Nebs q4h - reduce lasix  - DC BiPAP - he refused - o2 for usle ox ? 88% - ORAL Morphine test dose for dyspnea  CARDIOVASCULAR A:  NSTEMI, likely due to resp stress Shock, septic vs cardiogenic vs meds/sedation Not on pressors. Demand ischemia per cards. Bleeding at CVL site / Has new epigastric pain P:   -  Aspirin daily  RENAL A:   Chronic hypercarbic respiratory acidosis - stable. No renal issues otherwise P:   Monitor Dc lasix and monitor  GASTROINTESTINAL A:   Resoled epi pain Poor appoetitie Cachexia with moderate PCM  P nutn consult Start po diet PRN mylanta porotnix po BID  HEMATOLOGIC A:    Leukocytosis secondary to COPD exacerbation  P:  Monitor  INFECTIOUS Results for orders placed or performed during the hospital encounter of 01/26/15  Culture, expectorated sputum-assessment     Status: None   Collection Time: 01/26/15  5:29 PM  Result Value Ref Velazquez Status   Specimen Description SPUTUM  Final   Special Requests Normal  Final   Sputum evaluation   Final    THIS SPECIMEN IS ACCEPTABLE. RESPIRATORY CULTURE REPORT TO FOLLOW.   Report Status 01/26/2015 FINAL  Final  Culture, respiratory (NON-Expectorated)     Status: None   Collection Time: 01/26/15  6:59 PM  Result Value Ref Velazquez Status   Specimen Description SPUTUM  Final  Special Requests NONE  Final   Gram Stain   Final    ABUNDANT WBC RARE SQUAMOUS EPITHELIAL CELLS PRESENT NO ORGANISMS SEEN Performed at [facility]    Culture   Final    ABUNDANT STAPHYLOCOCCUS AUREUS Note: RIFAMPIN AND GENTAMICIN SHOULD NOT BE USED AS SINGLE DRUGS FOR TREATMENT OF STAPH INFECTIONS. Performed at [facility]    Report Status 01/30/2015 FINAL  Final   Organism ID, Bacteria STAPHYLOCOCCUS AUREUS  Final      Susceptibility   Staphylococcus aureus - MIC*    CLINDAMYCIN <=0.25 SENSITIVE Sensitive     ERYTHROMYCIN 0.5 SENSITIVE Sensitive     GENTAMICIN <=0.5 SENSITIVE Sensitive     LEVOFLOXACIN 0.25 SENSITIVE Sensitive     OXACILLIN 0.5 SENSITIVE Sensitive     RIFAMPIN <=0.5 SENSITIVE Sensitive     TRIMETH/SULFA <=10 SENSITIVE Sensitive     VANCOMYCIN 1 SENSITIVE Sensitive     TETRACYCLINE <=1 SENSITIVE Sensitive     MOXIFLOXACIN <=0.25 SENSITIVE Sensitive     * ABUNDANT STAPHYLOCOCCUS AUREUS  MRSA PCR Screening     Status: None   Collection Time: 01/26/15  9:50 PM  Result Value Ref Velazquez Status   MRSA by PCR NEGATIVE NEGATIVE Final    Comment:        The GeneXpert MRSA Assay (FDA approved for NASAL specimens only), is one component of a comprehensive MRSA colonization surveillance program. It is  not intended to diagnose MRSA infection nor to guide or monitor treatment for MRSA infections.     A:   COPD exacerbation - dyue to runing of meds and MSSSA bronchitis v CAP P:   Ceftriaxone and azithromycin started 12/17 pm > 12/21, doxy 12/21 >. (12/26)  NEUROLOGIC A:   ? Depressed Insomnia Class 4 dyspnea  P:   -pall care to address depression - start scheduled restoril - xanax prn - morphine test dose for dyspnea  FAMILY  - Updates: Updated patient 01/30/2015 - d/w {Pall care and him -they will see him for goals of care, symptoms and hospice eval. Need to make sure he is not depressed and asking for hospice as a reuslt     Dr. Brand Males, M.D., [facility].C.P Pulmonary and Critical Care Medicine Staff Physician [facility] Pager: [phone], If no answer or between  15:00h - 7:00h: call [phone]  01/30/2015 11:19 AM

## 2015-01-31 ENCOUNTER — Encounter (HOSPITAL_COMMUNITY): Payer: Self-pay | Admitting: *Deleted

## 2015-01-31 DIAGNOSIS — A4901 Methicillin susceptible Staphylococcus aureus infection, unspecified site: Secondary | ICD-10-CM

## 2015-01-31 LAB — MAGNESIUM: Magnesium: 2.6 mg/dL — ABNORMAL HIGH (ref 1.7–2.4)

## 2015-01-31 LAB — CBC
HEMATOCRIT: 43.2 % (ref 39.0–52.0)
HEMOGLOBIN: 14.6 g/dL (ref 13.0–17.0)
MCH: 29.5 pg (ref 26.0–34.0)
MCHC: 33.8 g/dL (ref 30.0–36.0)
MCV: 87.3 fL (ref 78.0–100.0)
Platelets: 232 10*3/uL (ref 150–400)
RBC: 4.95 MIL/uL (ref 4.22–5.81)
RDW: 13.7 % (ref 11.5–15.5)
WBC: 21.6 10*3/uL — AB (ref 4.0–10.5)

## 2015-01-31 LAB — BASIC METABOLIC PANEL
ANION GAP: 8 (ref 5–15)
BUN: 38 mg/dL — ABNORMAL HIGH (ref 6–20)
CHLORIDE: 83 mmol/L — AB (ref 101–111)
CO2: 39 mmol/L — ABNORMAL HIGH (ref 22–32)
Calcium: 9 mg/dL (ref 8.9–10.3)
Creatinine, Ser: 0.97 mg/dL (ref 0.61–1.24)
GFR calc non Af Amer: >60 mL/min (ref >60)
Glucose, Bld: 115 mg/dL — ABNORMAL HIGH (ref 65–99)
POTASSIUM: 5 mmol/L (ref 3.5–5.1)
SODIUM: 130 mmol/L — AB (ref 135–145)

## 2015-01-31 LAB — PHOSPHORUS: Phosphorus: 3.6 mg/dL (ref 2.5–4.6)

## 2015-01-31 MED ORDER — MORPHINE SULFATE (CONCENTRATE) 10 MG/0.5ML PO SOLN
5.0000 mg | ORAL | Status: DC | PRN
  Administered 2015-01-31 – 2015-02-01 (×9): 5 mg via ORAL
  Filled 2015-01-31 (×9): qty 0.5

## 2015-01-31 MED ORDER — SENNOSIDES-DOCUSATE SODIUM 8.6-50 MG PO TABS
2.0000 | ORAL_TABLET | Freq: Every day | ORAL | Status: DC
  Administered 2015-01-31: 2 via ORAL
  Filled 2015-01-31: qty 2

## 2015-01-31 MED ORDER — ENSURE ENLIVE PO LIQD
237.0000 mL | Freq: Two times a day (BID) | ORAL | Status: DC
  Administered 2015-01-31 – 2015-02-01 (×3): 237 mL via ORAL

## 2015-01-31 MED ORDER — MORPHINE SULFATE (CONCENTRATE) 10 MG/0.5ML PO SOLN
5.0000 mg | ORAL | Status: DC | PRN
  Administered 2015-01-31: 5 mg via ORAL
  Filled 2015-01-31: qty 0.5

## 2015-01-31 NOTE — Clinical Social Work Note (Signed)
Clinical Social Work Assessment  Patient Details  Name: Chad Velazquez MRN: [id number] Date of Birth: 11/19/1940  Date of referral:  01/31/15               Reason for consult:  Facility Placement                Permission sought to share information with:  Chartered certified accountant granted to share information::  Yes, Verbal Permission Granted  Name::     N/A, wife has laryngectomy  Agency::  [facility] SNFs  Relationship::     Contact Information:     Housing/Transportation Living arrangements for the past 2 months:  [place] of Information:  Patient Patient Interpreter Needed:  None Criminal Activity/Legal Involvement Pertinent to Current Situation/Hospitalization:  No - Comment as needed Significant Relationships:  Spouse Lives with:  Spouse Do you feel safe going back to the place where you live?  No Need for family participation in patient care:  No (Coment)  Care giving concerns:  CSW received referral for possible SNF placement at time of discharge. CSW met with patient regarding PT recommendation of SNF placement at time of discharge. Patient reported that his wife is unable to help him at home and she has a laryngectomy. Patient would like to get rehab to rebuild his strength before returning home to help his wife, Patient expressed understanding of PT recommendation and is agreeable to SNF placement at time of discharge. CSW to continue to follow and assist with discharge planning needs.   Social Worker assessment / plan:  CSW spoke with patient concerning possibility of rehab at [facility] before returning home.  Employment status:  Retired Nurse, adult PT Recommendations:  [place] / Referral to community resources:  [place]  Patient/Family's Response to care:  Patient recognizes need for rehab before returning home and is agreeable to a SNF in [place].    Patient/Family's Understanding of and Emotional Response to Diagnosis, Current Treatment, and Prognosis:  Patient is realistic regarding therapy needs. No questions/concerns about plan or treatment.    Emotional Assessment Appearance:  Appears stated age Attitude/Demeanor/Rapport:   (Appropriate) Affect (typically observed):  Accepting, Appropriate Orientation:  Oriented to Self, Oriented to Place, Oriented to  Time, Oriented to Situation Alcohol / Substance use:  Not Applicable Psych involvement (Current and /or in the community):  No (Comment)  Discharge Needs  Concerns to be addressed:  Care Coordination Readmission within the last 30 days:  No Current discharge risk:  None Barriers to Discharge:  Continued Medical Work up   [employer], [place] 01/31/2015, 11:35 AM

## 2015-01-31 NOTE — Progress Notes (Signed)
PT Cancellation Note  Patient Details Name: Chad Velazquez MRN: [id number] DOB: Oct 07, 1940   Cancelled Treatment:    Reason Eval/Treat Not Completed: Fatigue limiting ability to participate. Pt reports he needs to rest and asked to wait until later. Will follow up as time allows this afternoon or tomorrow.  Joselin Crandell 01/31/2015, 3:27 PM [facility] PT [phone]

## 2015-01-31 NOTE — Progress Notes (Signed)
Daily Progress Note   Patient Name: Chad Velazquez       Date: 01/31/2015 DOB: Mar 19, 1940  Age: 74 y.o. MRN#: [id number] Attending Physician: Brand Males, MD Primary Care Physician: Tamsen Roers, MD Admit Date: 01/26/2015  Reason for Consultation/Follow-up: Establishing goals of care  Subjective: I spoke with Chad Velazquez again today. He tells me that he spoke with Dr. Chase Caller today and he is considering trying some rehab and asks what I think about this. We agree that rehab may be worth a shot to see if he can regain any strength although his nutrition is not great either but he says he can drink some Ensure. He mentioned inpatient rehab here in the hospital and we discussed that this is a more intensive rehab and people typically rehab ~3 hours daily. We both agree that this would be too much for him. He is a better candidate for SNF rehab and we discuss this process. Also discussed transition to hospice at home after rehab as he says he will still desire this and I believe he will still be eligible for this. With his permission I have contacted Bambi Anderson with [facility] and she will help make sure that he gets hospice upon d/c from SNF.   Of note he still has financial concerns especially concerning his wife who he says is on Medicaid and she needs assistance applying for social security?? He says doing these things are very difficult as she cannot communicate d/t laryngectomy done 3 yrs ago. I have spoken with CSW about these concerns who says she will speak with CMRN. I think the best success might come from social worker with hospice who can hopefully assist with this as well.   He also tells me that the roxanol and restoril combination last night gave him wonderful sleep - he is very  happy with this regimen. We discuss that this can be adjusted as needed to achieve comfort in the future. He is very appreciative of the care he has received.   Length of Stay: 5 days  Current Medications: Scheduled Meds:  . aspirin  81 mg Oral Daily  . diltiazem  30 mg Oral 4 times per day  . doxycycline  100 mg Oral Q12H  . feeding supplement  1 Container Oral TID BM  . furosemide  20  mg Intravenous Daily  . levalbuterol  0.63 mg Nebulization QID  . pantoprazole  40 mg Oral BID AC  . predniSONE  10 mg Oral Q breakfast  . senna-docusate  2 tablet Oral QHS  . temazepam  7.5 mg Oral QHS    Continuous Infusions:    PRN Meds: albuterol, ALPRAZolam, alum & mag hydroxide-simeth, morphine CONCENTRATE, ondansetron (ZOFRAN) IV  Physical Exam: Physical Exam  Constitutional: He is oriented to person, place, and time. He appears well-developed. He appears cachectic.  Cardiovascular: Normal rate.   Pulmonary/Chest: Effort normal. No accessory muscle usage. No tachypnea. No respiratory distress.  + cough  Abdominal: Normal appearance.  Neurological: He is alert and oriented to person, place, and time.  Psychiatric: He has a normal mood and affect.                Vital Signs: BP 119/67 mmHg  Pulse 92  Temp(Src) 97.5 F (36.4 C) (Oral)  Resp 16  Ht 5\' 8"  (1.727 m)  Wt 55.6 kg (122 lb 9.2 oz)  BMI 18.64 kg/m2  SpO2 94% SpO2: SpO2: 94 % O2 Device: O2 Device: Nasal Cannula O2 Flow Rate: O2 Flow Rate (L/min): 4 L/min  Intake/output summary:  Intake/Output Summary (Last 24 hours) at 01/31/15 1034 Last data filed at 01/31/15 0500  Gross per 24 hour  Intake    120 ml  Output   1350 ml  Net  -1230 ml   LBM: Last BM Date: 01/28/15 Baseline Weight: Weight: 52.164 kg (115 lb) Most recent weight: Weight: 55.6 kg (122 lb 9.2 oz)       Palliative Assessment/Data: Flowsheet Rows        Most Recent Value   Intake Tab    Referral Department  Critical care   Unit at Time of Referral   ICU   Palliative Care Primary Diagnosis  Pulmonary   Date Notified  01/29/15   Palliative Care Type  New Palliative care   Reason for referral  Clarify Goals of Care   Date of Admission  01/26/15   Date first seen by Palliative Care  01/30/15   # of days Palliative referral response time  1 Day(s)   # of days IP prior to Palliative referral  3   Clinical Assessment    Psychosocial & Spiritual Assessment    Palliative Care Outcomes       Additional Data Reviewed: CBC    Component Value Date/Time   WBC 21.6* 01/31/2015 0616   RBC 4.95 01/31/2015 0616   HGB 14.6 01/31/2015 0616   HCT 43.2 01/31/2015 0616   PLT 232 01/31/2015 0616   MCV 87.3 01/31/2015 0616   MCH 29.5 01/31/2015 0616   MCHC 33.8 01/31/2015 0616   RDW 13.7 01/31/2015 0616   LYMPHSABS 2.9 01/26/2015 1701   MONOABS 1.0 01/26/2015 1701   EOSABS 0.6 01/26/2015 1701   BASOSABS 0.0 01/26/2015 1701    CMP     Component Value Date/Time   NA 130* 01/31/2015 0616   K 5.0 01/31/2015 0616   CL 83* 01/31/2015 0616   CO2 39* 01/31/2015 0616   GLUCOSE 115* 01/31/2015 0616   BUN 38* 01/31/2015 0616   CREATININE 0.97 01/31/2015 0616   CALCIUM 9.0 01/31/2015 0616   PROT 6.4* 01/26/2015 2019   ALBUMIN 3.2* 01/26/2015 2019   AST 36 01/26/2015 2019   ALT 21 01/26/2015 2019   ALKPHOS 69 01/26/2015 2019   BILITOT 1.2 01/26/2015 2019   GFRNONAA >60 01/31/2015 [id number]  GFRAA >60 01/31/2015 [id number]       Problem List:  Patient Active Problem List   Diagnosis Date Noted  . MSSA (methicillin susceptible Staphylococcus aureus) infection 01/31/2015  . Palliative care encounter 01/30/2015  . Shortness of breath 01/30/2015  . Sinus tachycardia ([place]) 01/29/2015  . Protein-calorie malnutrition, severe 01/29/2015  . Elevated troponin 01/28/2015  . Acute respiratory failure with hypoxia ([place]) 01/27/2015  . COPD exacerbation ([place]) 01/26/2015  . COPD with exacerbation ([place]) 07/01/2011  . Solitary pulmonary nodule 03/25/2010  .  EMPHYSEMA 11/11/2009     Palliative Care Assessment & Plan    1.Code Status:  DNR    Code Status Orders        Start     Ordered   01/31/15 0804  Do not attempt resuscitation (DNR)   Continuous    Question Answer Comment  In the event of cardiac or respiratory ARREST Do not call a "code blue"   In the event of cardiac or respiratory ARREST Do not perform Intubation, CPR, defibrillation or ACLS   In the event of cardiac or respiratory ARREST Use medication by any route, position, wound care, and other measures to relive pain and suffering. May use oxygen, suction and manual treatment of airway obstruction as needed for comfort.      01/31/15 0805       2. Goals of Care/Additional Recommendations:  He would like to try some rehab therapy to see if he can gain even a little more strength. Goal is still focused on comfort and not to suffer. Still wants hospice at home after rehab.   Limitations on Scope of Treatment: Avoid Hospitalization  Desire for further Chaplaincy support:yes  Psycho-social Needs: Caregiving  Support/Resources  3. Symptom Management:   SOB/pain: Continue Roxanol 5 mg every hour prn (may increase dosage as needed to be effective).   Sleep: Continue Restoril.   Anxiety: Continue Xanax prn - may consider scheduled dosing with worsening.   Bowel Regimen: Senokot-S qhs.  4. Palliative Prophylaxis:   Bowel Regimen, Delirium Protocol and Frequent Pain Assessment  5. Prognosis: < 6 months  6. Discharge Planning:  SNF rehab followed by home with hospice.    Thank you for allowing the Palliative Medicine Team to assist in the care of this patient.   Time In/Out: 0920-0940, 1050-1110 Total Time 86min Prolonged Time Billed  no         Pershing Proud, NP  [id number], 10:34 AM  Please contact Palliative Medicine Team phone at [phone] for questions and concerns.

## 2015-01-31 NOTE — NC FL2 (Signed)
[place] LEVEL OF CARE SCREENING TOOL     IDENTIFICATION  Patient Name: Chad Velazquez Birthdate: September 03, 1940 Sex: male Admission Date (Current Location): 01/26/2015  [facility] and [place] Number:  Herbalist and Address:  [facility], [place] [address] [place], [place] [address]      Provider Number: [id number]  Attending Physician Name and Address:  Brand Males, MD  Relative Name and Phone Number:  N/A    Current Level of Care: Hospital Recommended Level of Care: [place] Prior Approval Number:    Date Approved/Denied:   PASRR Number: [id number]  Discharge Plan: SNF    Current Diagnoses: Patient Active Problem List   Diagnosis Date Noted  . MSSA (methicillin susceptible Staphylococcus aureus) infection 01/31/2015  . Palliative care encounter 01/30/2015  . Shortness of breath 01/30/2015  . Sinus tachycardia ([place]) 01/29/2015  . Protein-calorie malnutrition, severe 01/29/2015  . Elevated troponin 01/28/2015  . Acute respiratory failure with hypoxia ([place]) 01/27/2015  . COPD exacerbation ([place]) 01/26/2015  . COPD with exacerbation ([place]) 07/01/2011  . Solitary pulmonary nodule 03/25/2010  . EMPHYSEMA 11/11/2009    Orientation RESPIRATION BLADDER Height & Weight    Self, Time, Situation, Place  O2 Continent, Indwelling catheter (Urinary catheter)   122 lbs.  BEHAVIORAL SYMPTOMS/MOOD NEUROLOGICAL BOWEL NUTRITION STATUS   (N/A)   Continent Diet (Carb modified)  AMBULATORY STATUS COMMUNICATION OF NEEDS Skin   Extensive Assist Verbally Other (Comment) (Skin tear on wrist)                       Personal Care Assistance Level of Assistance  Bathing, Feeding, Dressing Bathing Assistance: Maximum assistance Feeding assistance: Limited assistance Dressing Assistance: Maximum assistance     Functional Limitations Info             SPECIAL CARE FACTORS FREQUENCY  PT (By licensed PT)     PT  Frequency: 3x/week              Contractures      Additional Factors Info  Code Status, Allergies Code Status Info: DNR Allergies Info: NKA           Current Medications (01/31/2015):  This is the current hospital active medication list Current Facility-Administered Medications  Medication Dose Route Frequency Provider Last Rate Last Dose  . albuterol (PROVENTIL) (2.5 MG/3ML) 0.083% nebulizer solution 2.5 mg  2.5 mg Nebulization Q3H PRN Anders Simmonds, MD   2.5 mg at 01/28/15 0513  . ALPRAZolam Duanne Moron) tablet 0.25 mg  0.25 mg Oral BID PRN Mauri [place], MD   0.25 mg at 01/30/15 2111  . alum & mag hydroxide-simeth (MAALOX/MYLANTA) 200-200-20 MG/5ML suspension 30 mL  30 mL Oral Q6H PRN Anders Simmonds, MD   30 mL at 01/29/15 1233  . aspirin chewable tablet 81 mg  81 mg Oral Daily Beverely Low, MD   81 mg at 01/31/15 0917  . diltiazem (CARDIZEM) tablet 30 mg  30 mg Oral 4 times per day Sueanne Margarita, MD   30 mg at 01/31/15 0917  . doxycycline (VIBRA-TABS) tablet 100 mg  100 mg Oral Q12H Brand Males, MD   100 mg at 01/30/15 2110  . feeding supplement (ENSURE ENLIVE) (ENSURE ENLIVE) liquid 237 mL  237 mL Oral BID BM Pershing Proud, NP      . furosemide (LASIX) injection 20 mg  20 mg Intravenous Daily Brand Males, MD   20 mg  at 01/31/15 0917  . levalbuterol (XOPENEX) nebulizer solution 0.63 mg  0.63 mg Nebulization QID Brand Males, MD   0.63 mg at 01/31/15 0846  . morphine CONCENTRATE 10 MG/0.5ML oral solution 5 mg  5 mg Oral Q1H PRN Wilhelmina Mcardle, MD   5 mg at 01/31/15 [phone]  . ondansetron (ZOFRAN) injection 4 mg  4 mg Intravenous Q8H PRN Anders Simmonds, MD   4 mg at 01/29/15 1448  . pantoprazole (PROTONIX) EC tablet 40 mg  40 mg Oral BID AC Mikael Spray, NP   40 mg at 01/31/15 0917  . predniSONE (DELTASONE) tablet 10 mg  10 mg Oral Q breakfast Brand Males, MD   10 mg at 01/31/15 0917  . senna-docusate (Senokot-S) tablet 2 tablet  2 tablet Oral QHS Pershing Proud, NP      . temazepam (RESTORIL) capsule 7.5 mg  7.5 mg Oral QHS Brand Males, MD   7.5 mg at 01/30/15 2110     Discharge Medications: Please see discharge summary for a list of discharge medications.  Relevant Imaging Results:  Relevant Lab Results:   Additional Information SSN: [id number]  Benard Halsted, LCSWA

## 2015-01-31 NOTE — Progress Notes (Signed)
Pt has refused his neb treatment at this time. RN notified.

## 2015-01-31 NOTE — Progress Notes (Signed)
PULMONARY / CRITICAL CARE MEDICINE History and Physical  Name: NASHUA HOMEWOOD MRN: [id number] DOB: 01-Nov-1940    ADMISSION DATE:  01/26/2015 CONSULTATION DATE:  01/26/15  REFERRING MD:  Dr Reather Converse, MD  CHIEF COMPLAINT:  Respiratory failure  BRIEF  DAI MCADAMS is a 74 y.o. male with advanced COPD who presents with shortness of breath. Per the ED staff, the patient reported 4 days of worsening shortness of breath after running out of his inhaler therapies for COPD. We are uncertain if he had fevers, chills, URI symptoms or changes in his sputum production. He received Methylprednisolone en route by EMS. In the ED, he was found to be hypoxic and hypercarbic. After a trial of BiPAP, the patient requested intubation due to continued dyspnea. He was transiently hypotensive after intubation and sedation but responded to fluid boluses. A CXR was done and revealed hyperinflated lungs, chronic scarring but no focal consolidations which were new. He reportedly had chest pain as well with EKG having concerns for ST segment elevation in V3-5. Cardiology was called but believe this is secondary to stress from his respiratory status. A POC troponin was negative in the ED.   Interval events / Subjective:  12/18 Tolerating some PSV now.  Slight residual wheeze  01/28/15 - extubated yesterday. Used BiPAP qhs. Pulse ox 100% on 4L [place]. Reports wheezing and epigastric pain. Denies this is chest pain RN reports bleeding around CVL site - On IV heparin. Cards reports demand ischemia. ECHO pending. Overall he is only 30% near baaseline and only some better since admit   12/20: O2 sat stable on 3L  [place]; reports mild sob/sobe, anorexia and epigastric pain which he believes is acid reflux. No further bleeding from CVL site.    01/30/15- asking for symptom oriented care. Thinks he would benefit from hospice. Thinks end is near. Says ambien lst night helped dyspnea and insomnia.  No appetite. Says not to dc him home  soon because he has no social support - wife is s/p layrngectomy.  RN asking PT consult. RESP culture 12/17 - growing MSSA    SUBJECTIVE/OVERNIGHT/INTERVAL HX 01/31/2015 - morphine for dyspnea and restoril for insomnia helped a lot. RN said they assisted him to bathroom but most of time he is in bed. ECOG =4 . He accepted home hospice but does not want discharge untile after xmas. Prior to admission he was caring for his wife. Now he has no idea who is caring for her. . Suddenly he is changing his mind - says he wants to see if he can get his strenght improved so he can care for her. This is why he wanted to postpone dc. Seems wanting to explore inpatient rehab v LTAC v snf rehab  VITAL SIGNS: BP 159/88 mmHg  Pulse 93  Temp(Src) 97.5 F (36.4 C) (Oral)  Resp 16  Ht 5' 8"  (1.727 m)  Wt 55.6 kg (122 lb 9.2 oz)  BMI 18.64 kg/m2  SpO2 94%  VENTILATOR SETTINGS:    INTAKE / OUTPUT: I/O last 3 completed shifts: In: 600 [P.O.:300; IV Piggyback:300] Out: 2325 [Urine:2325]  PHYSICAL EXAMINATION: General:  Very frail and very Deconditioned male. Awake, Alert Psych: ? depresed - unlear Neuro:  Calm. RASS +1. Moves all 4. Cogniition intact. CAM0-ICU neg for delirum HEENT:  Moist membranes,  normal sclera Cardiovascular:  Tachycardic, irregular rhythm, II/VI systolic murmur over LSB Lungs: Bilateral wheezing nearly resolved, rhonchi and mildly labored respirtaions (on q4h nebs + po pred) Abdomen:  Soft, nontender,  nondistended Musculoskeletal:  No focal findings Skin:  No rash  LABS:  PULMONARY  Recent Labs Lab 01/26/15 1739 01/26/15 1931 01/26/15 2305 01/27/15 0420  PHART 7.266* 7.226* 7.294* 7.344*  PCO2ART 63.3* 58.0* 46.3* 43.4  PO2ART 169.0* 139.0* 135* 140*  HCO3 28.8* 24.1* 21.8 23.1  TCO2 31 26 23.2 24.4  O2SAT 99.0 99.0 98.6 98.8    CBC  Recent Labs Lab 01/29/15 0205 01/30/15 0530 01/31/15 0616  HGB 13.8 14.4 14.6  HCT 40.9 42.5 43.2  WBC 29.4* 27.5* 21.6*   PLT 235 234 232    COAGULATION  Recent Labs Lab 01/26/15 1701  INR 1.14    CARDIAC    Recent Labs Lab 01/27/15 0259 01/27/15 0922 01/28/15 1200 01/28/15 1938 01/29/15 0205  TROPONINI 0.71* 0.66* 0.37* 0.23* 0.17*  0.16*   No results for input(s): PROBNP in the last 168 hours.   CHEMISTRY  Recent Labs Lab 01/27/15 0259 01/28/15 0535 01/29/15 0205 01/30/15 0530 01/31/15 0616  NA 130* 133* 134* 132* 130*  K 4.4 4.9 4.5 5.1 5.0  CL 97* 94* 92* 89* 83*  CO2 24 30 33* 35* 39*  GLUCOSE 252* 99 153* 125* 115*  BUN 12 17 32* 37* 38*  CREATININE 1.14 0.87 1.03 0.96 0.97  CALCIUM 7.9* 8.6* 8.9 9.1 9.0  MG 2.5* 2.4 2.5* 2.9* 2.6*  PHOS 3.4 2.8 3.2 3.3 3.6   Estimated Creatinine Clearance: 51.7 mL/min (by C-G formula based on Cr of 0.97).   LIVER  Recent Labs Lab 01/26/15 1701 01/26/15 2019  AST  --  36  ALT  --  21  ALKPHOS  --  69  BILITOT  --  1.2  PROT  --  6.4*  ALBUMIN  --  3.2*  INR 1.14  --      INFECTIOUS  Recent Labs Lab 01/27/15 0922 01/28/15 0540  LATICACIDVEN 1.3 1.3     ENDOCRINE CBG (last 3)   Recent Labs  01/29/15 0756  GLUCAP 126*         IMAGING x48h  - image(s) personally visualized  -   highlighted in bold No results found.     DISCUSSION: Chad Velazquez is a 74 y.o. male with severe COPD (FEV1 24% predicted in May 2016) who presents with acute on chronic hypoxic and hypercarbic respiratory failure in the setting of COPD exacerbation and MSSA bronchitis /CAP   ASSESSMENT / PLAN:  PULMONARY A: Acute hypoxic respiratory failure in setting of advanced COPD with chronic hypercarbia -  COPD exacerbation from MSSSA infection versus lack of medications. He is now s/p extubation 01/27/15. Wheezing resolved   P:   - prednisone since 01/30/15 - 2 week taper - Antibiotics as below - Nebs q4h - reduce lasix  - o2 for usle ox > 88% - ORAL Morphine  for dyspnea  CARDIOVASCULAR A:  NSTEMI, likely due to  resp stress Shock, septic vs cardiogenic vs meds/sedation Not on pressors. Demand ischemia per cards. Bleeding at CVL site / Has new epigastric pain P:   -  Aspirin daily  RENAL A:   Chronic hypercarbic respiratory acidosis - stable. No renal issues otherwise. Off lasix since 01/30/15 P:   Monitor   GASTROINTESTINAL A:   Resoled epi pain Poor appoetitie Cachexia with moderate PCM   - severe protein calorie malnutrition  P nutn consult Start po diet PRN mylanta porotnix po BID  HEMATOLOGIC A:   Leukocytosis secondary to COPD exacerbation  P:  Monitor  INFECTIOUS Sputum 01/30/15 -  MSSA  A:   COPD exacerbation - dyue to runing of meds and MSSA bronchitis v CAP P:   Ceftriaxone and azithromycin started 12/17 pm > 12/21, doxy 12/21 >. (12/26)  NEUROLOGIC A:   ? Depressed - d/w pall care - unlikely Insomnia Class 4 dyspnea  P:   - start scheduled restoril - xanax prn - morphine testfor dyspnea   GLOBALL A Very deconditioned  P PT consult  -  Care mgmt consult - SNF rehab v LTAC v Inpatient rehab  FAMILY  - Updates: Updated patient 25/18/9842  And d/w Elmo Putt of Pall care -> he is hospice eligible. He accepted hospice 12/21/116 but 01/31/15 expressing desire to give attempt to get stronger to care for wife. Consult care mgmjt. Continue concurrent pall care + medical care + DNR     Dr. Brand Males, M.D., [facility].C.P Pulmonary and Critical Care Medicine Staff Physician [place] [facility] Pager: [phone], If no answer or between  15:00h - 7:00h: call [phone]  01/31/2015 8:54 AM

## 2015-01-31 NOTE — Progress Notes (Signed)
Nutrition Brief Note  Chart reviewed. Pt now transitioning to comfort care.  No further nutrition interventions warranted at this time.  Please re-consult as needed.   Zoelle Markus A. Joreen Swearingin, RD, LDN, CDE Pager: [phone] After hours Pager: [phone]  

## 2015-02-01 DIAGNOSIS — A4901 Methicillin susceptible Staphylococcus aureus infection, unspecified site: Secondary | ICD-10-CM

## 2015-02-01 LAB — CBC
HEMATOCRIT: 44.6 % (ref 39.0–52.0)
Hemoglobin: 14.7 g/dL (ref 13.0–17.0)
MCH: 29 pg (ref 26.0–34.0)
MCHC: 33 g/dL (ref 30.0–36.0)
MCV: 88 fL (ref 78.0–100.0)
Platelets: 206 10*3/uL (ref 150–400)
RBC: 5.07 MIL/uL (ref 4.22–5.81)
RDW: 13.5 % (ref 11.5–15.5)
WBC: 19.2 10*3/uL — AB (ref 4.0–10.5)

## 2015-02-01 LAB — BASIC METABOLIC PANEL
Anion gap: 8 (ref 5–15)
BUN: 40 mg/dL — ABNORMAL HIGH (ref 6–20)
CHLORIDE: 85 mmol/L — AB (ref 101–111)
CO2: 39 mmol/L — ABNORMAL HIGH (ref 22–32)
Calcium: 9 mg/dL (ref 8.9–10.3)
Creatinine, Ser: 0.76 mg/dL (ref 0.61–1.24)
GFR calc non Af Amer: >60 mL/min (ref >60)
Glucose, Bld: 106 mg/dL — ABNORMAL HIGH (ref 65–99)
POTASSIUM: 4.2 mmol/L (ref 3.5–5.1)
SODIUM: 132 mmol/L — AB (ref 135–145)

## 2015-02-01 LAB — PHOSPHORUS: Phosphorus: 3.1 mg/dL (ref 2.5–4.6)

## 2015-02-01 LAB — MAGNESIUM: Magnesium: 2.2 mg/dL (ref 1.7–2.4)

## 2015-02-01 MED ORDER — ASPIRIN 81 MG PO CHEW: 81.0000 mg | CHEWABLE_TABLET | Freq: Every day | ORAL | Status: AC

## 2015-02-01 MED ORDER — INFLUENZA VAC SPLIT QUAD 0.5 ML IM SUSY: 0.5000 mL | PREFILLED_SYRINGE | INTRAMUSCULAR | Status: DC

## 2015-02-01 MED ORDER — ALUM & MAG HYDROXIDE-SIMETH 200-200-20 MG/5ML PO SUSP: 30.0000 mL | Freq: Four times a day (QID) | ORAL | Status: AC | PRN

## 2015-02-01 MED ORDER — TEMAZEPAM 7.5 MG PO CAPS: 7.5000 mg | ORAL_CAPSULE | Freq: Every day | ORAL | Status: DC

## 2015-02-01 MED ORDER — DILTIAZEM HCL 30 MG PO TABS: 30.0000 mg | ORAL_TABLET | Freq: Four times a day (QID) | ORAL | Status: AC

## 2015-02-01 MED ORDER — DOXYCYCLINE HYCLATE 100 MG PO TABS: 100.0000 mg | ORAL_TABLET | Freq: Two times a day (BID) | ORAL | Status: DC

## 2015-02-01 MED ORDER — ENSURE ENLIVE PO LIQD: 237.0000 mL | Freq: Three times a day (TID) | ORAL | Status: DC

## 2015-02-01 MED ORDER — MORPHINE SULFATE (CONCENTRATE) 10 MG/0.5ML PO SOLN: 5.0000 mg | ORAL | Status: DC | PRN

## 2015-02-01 MED ORDER — ALPRAZOLAM 0.25 MG PO TABS: 0.2500 mg | ORAL_TABLET | Freq: Two times a day (BID) | ORAL | Status: AC | PRN

## 2015-02-01 NOTE — Clinical Social Work Placement (Signed)
   CLINICAL SOCIAL WORK PLACEMENT  NOTE  Date:  02/01/2015  Patient Details  Name: Chad Velazquez MRN: [id number] Date of Birth: 03/16/1940  Clinical Social Work is seeking post-discharge placement for this patient at the [place] level of care (*CSW will initial, date and re-position this form in  chart as items are completed):  Yes   Patient/family provided with [place] Work Department's list of facilities offering this level of care within the geographic area requested by the patient (or if unable, by the patient's family).  Yes   Patient/family informed of their freedom to choose among providers that offer the needed level of care, that participate in Medicare, Medicaid or managed care program needed by the patient, have an available bed and are willing to accept the patient.  Yes   Patient/family informed of [place]'s ownership interest in [facility] and [facility], as well as of the fact that they are under no obligation to receive care at these facilities.  PASRR submitted to EDS on 01/31/15     PASRR number received on 01/31/15     Existing PASRR number confirmed on       FL2 transmitted to all facilities in geographic area requested by pt/family on 01/31/15     FL2 transmitted to all facilities within larger geographic area on       Patient informed that his/her managed care company has contracts with or will negotiate with certain facilities, including the following:        Yes   Patient/family informed of bed offers received.  Patient chooses bed at [facility]     Physician recommends and patient chooses bed at      Patient to be transferred to [facility] on 02/01/15.  Patient to be transferred to facility by PTAR     Patient family notified on 02/01/15 of transfer.  Name of family member notified:  N/A     PHYSICIAN Please prepare prescriptions     Additional Comment:     _______________________________________________ Benard Halsted, [place] 02/01/2015, 12:28 PM

## 2015-02-01 NOTE — Clinical Social Work Placement (Signed)
   CLINICAL SOCIAL WORK PLACEMENT  NOTE  Date:  02/01/2015  Patient Details  Name: Chad Velazquez MRN: [id number] Date of Birth: 1940/06/22  Clinical Social Work is seeking post-discharge placement for this patient at the [place] level of care (*CSW will initial, date and re-position this form in  chart as items are completed):  Yes   Patient/family provided with [place] Work Department's list of facilities offering this level of care within the geographic area requested by the patient (or if unable, by the patient's family).  Yes   Patient/family informed of their freedom to choose among providers that offer the needed level of care, that participate in Medicare, Medicaid or managed care program needed by the patient, have an available bed and are willing to accept the patient.  Yes   Patient/family informed of [place]'s ownership interest in [facility] and [facility], as well as of the fact that they are under no obligation to receive care at these facilities.  PASRR submitted to EDS on 01/31/15     PASRR number received on 01/31/15     Existing PASRR number confirmed on       FL2 transmitted to all facilities in geographic area requested by pt/family on 01/31/15     FL2 transmitted to all facilities within larger geographic area on       Patient informed that his/her managed care company has contracts with or will negotiate with certain facilities, including the following:        Yes   Patient/family informed of bed offers received.  Patient chooses bed at [facility]     Physician recommends and patient chooses bed at      Patient to be transferred to [facility] on 02/01/15.  Patient to be transferred to facility by PTAR     Patient family notified on 02/01/15 of transfer.  Name of family member notified:  N/A     PHYSICIAN       Additional Comment:     _______________________________________________ Benard Halsted, [place] 02/01/2015, 5:01 PM

## 2015-02-01 NOTE — Progress Notes (Signed)
Patient was discharged to nursing home [facility]) by MD order; discharged instructions review and sent to facility with care notes and prescriptions; IV DIC; facility was called and report was given to nurse Antoniette;  patient will be transported to facility via [facility].

## 2015-02-01 NOTE — Progress Notes (Signed)
I met with [name] today.  He reports that his current dose of morphine is very helpful in relieving his dyspnea.  His major concern today that he talks about is how his wife will be taken care of in the future. He reports that she has medical care needs that he helps to provide for her and his Social Security is also the primary income of the home. His concern lies in the fact that they had been living together for over 30 years but are not legally married. He is concerned that if he were to die she would have no financial resources rely on.  Overall, he is invested in plan to continue with current therapy and transition to a skilled facility for rehabilitation. He denies any other needs at this time other than requesting to have Ensure more frequently. I increased this to 3 times daily. Please let us know if we can be of further assistance in his care moving forward.  Micheline Rough, MD [facility] [phone]

## 2015-02-01 NOTE — Care Management Note (Signed)
Case Management Note  Patient Details  Name: Chad Velazquez MRN: [id number] Date of Birth: 1940-10-20  Subjective/Objective:    Patient is for dc to snf, CSW following.                Action/Plan:   Expected Discharge Date:                  Expected Discharge Plan:  Skilled Nursing Facility  In-House Referral:  Clinical Social Work  Discharge planning Services  CM Consult  Post Acute Care Choice:    Choice offered to:     DME Arranged:    DME Agency:     HH Arranged:    [place] Agency:     Status of Service:  Completed, signed off  Medicare Important Message Given:  Yes Date Medicare IM Given:    Medicare IM give by:    Date Additional Medicare IM Given:    Additional Medicare Important Message give by:     If discussed at [place] of Stay Meetings, dates discussed:    Additional Comments:  Zenon Mayo, RN 02/01/2015, 4:02 PM

## 2015-02-01 NOTE — Progress Notes (Signed)
PULMONARY / CRITICAL CARE MEDICINE History and Physical  Name: Chad Velazquez MRN: [id number] DOB: 1940/03/10    ADMISSION DATE:  01/26/2015 CONSULTATION DATE:  01/26/15  REFERRING MD:  Dr Reather Converse, MD  CHIEF COMPLAINT:  Respiratory failure  BRIEF  Chad Velazquez is a 74 y.o. male with advanced COPD who presents with shortness of breath. Per the ED staff, the patient reported 4 days of worsening shortness of breath after running out of his inhaler therapies for COPD. We are uncertain if he had fevers, chills, URI symptoms or changes in his sputum production. He received Methylprednisolone en route by EMS. In the ED, he was found to be hypoxic and hypercarbic. After a trial of BiPAP, the patient requested intubation due to continued dyspnea. He was transiently hypotensive after intubation and sedation but responded to fluid boluses. A CXR was done and revealed hyperinflated lungs, chronic scarring but no focal consolidations which were new. He reportedly had chest pain as well with EKG having concerns for ST segment elevation in V3-5. Cardiology was called but believe this is secondary to stress from his respiratory status. A POC troponin was negative in the ED.   Interval events / Subjective:  12/18 Tolerating some PSV now.  Slight residual wheeze  01/28/15 - extubated yesterday. Used BiPAP qhs. Pulse ox 100% on 4L [place]. Reports wheezing and epigastric pain. Denies this is chest pain RN reports bleeding around CVL site - On IV heparin. Cards reports demand ischemia. ECHO pending. Overall he is only 30% near baaseline and only some better since admit   12/20: O2 sat stable on 3L  [place]; reports mild sob/sobe, anorexia and epigastric pain which he believes is acid reflux. No further bleeding from CVL site.    01/30/15- asking for symptom oriented care. Thinks he would benefit from hospice. Thinks end is near. Says ambien lst night helped dyspnea and insomnia.  No appetite. Says not to dc him home  soon because he has no social support - wife is s/p layrngectomy.  RN asking PT consult. RESP culture 12/17 - growing MSSA   01/31/2015 - morphine for dyspnea and restoril for insomnia helped a lot. RN said they assisted him to bathroom but most of time he is in bed. ECOG =4 . He accepted home hospice but does not want discharge untile after xmas. Prior to admission he was caring for his wife. Now he has no idea who is caring for her. . Suddenly he is changing his mind - says he wants to see if he can get his strenght improved so he can care for her. This is why he wanted to postpone dc. Seems wanting to explore inpatient rehab v LTAC v snf rehab   SUBJECTIVE/OVERNIGHT/INTERVAL HX 02/01/15 - Likely SNF today per social work. AFter that home with hospice is his goal. Says he will follow with me but has no transport. Definitely wants hospice at dc from snf rehab. Mostly in bed per RN. Symptoms improved with morphine and benzo  VITAL SIGNS: BP 130/87 mmHg  Pulse 102  Temp(Src) 97.6 F (36.4 C) (Oral)  Resp 16  Ht _0  (1.727 m)  Wt 52.6 kg (115 lb 15.4 oz)  BMI 17.64 kg/m2  SpO2 96%  VENTILATOR SETTINGS:    INTAKE / OUTPUT: I/O last 3 completed shifts: In: 98 [P.O.:420] Out: 1560 [Urine:1560]  PHYSICAL EXAMINATION: General:  Very frail and very Deconditioned male. Awake, Alert Psych: pleasant Neuro:  Calm. RASS +1. Moves all 4. Cogniition intact.  CAM0-ICU neg for delirum HEENT:  Moist membranes,  normal sclera Cardiovascular:  Tachycardic, irregular rhythm, II/VI systolic murmur over LSB Lungs: Bilateral wheezing nearly resolved, rhonchi and mildly labored respirtaions (on q4h nebs + po pred) Abdomen:  Soft, nontender, nondistended Musculoskeletal:  No focal findings Skin:  No rash  LABS:  PULMONARY  Recent Labs Lab 01/26/15 1739 01/26/15 1931 01/26/15 2305 01/27/15 0420  PHART 7.266* 7.226* 7.294* 7.344*  PCO2ART 63.3* 58.0* 46.3* 43.4  PO2ART 169.0* 139.0* 135*  140*  HCO3 28.8* 24.1* 21.8 23.1  TCO2 31 26 23.2 24.4  O2SAT 99.0 99.0 98.6 98.8    CBC  Recent Labs Lab 01/30/15 0530 01/31/15 0616 02/01/15 0618  HGB 14.4 14.6 14.7  HCT 42.5 43.2 44.6  WBC 27.5* 21.6* 19.2*  PLT 234 232 206    COAGULATION  Recent Labs Lab 01/26/15 1701  INR 1.14    CARDIAC    Recent Labs Lab 01/27/15 0259 01/27/15 0922 01/28/15 1200 01/28/15 1938 01/29/15 0205  TROPONINI 0.71* 0.66* 0.37* 0.23* 0.17*  0.16*   No results for input(s): PROBNP in the last 168 hours.   CHEMISTRY  Recent Labs Lab 01/28/15 0535 01/29/15 0205 01/30/15 0530 01/31/15 0616 02/01/15 0618  NA 133* 134* 132* 130* 132*  K 4.9 4.5 5.1 5.0 4.2  CL 94* 92* 89* 83* 85*  CO2 30 33* 35* 39* 39*  GLUCOSE 99 153* 125* 115* 106*  BUN 17 32* 37* 38* 40*  CREATININE 0.87 1.03 0.96 0.97 0.76  CALCIUM 8.6* 8.9 9.1 9.0 9.0  MG 2.4 2.5* 2.9* 2.6* 2.2  PHOS 2.8 3.2 3.3 3.6 3.1   Estimated Creatinine Clearance: 59.4 mL/min (by C-G formula based on Cr of 0.76).   LIVER  Recent Labs Lab 01/26/15 1701 01/26/15 2019  AST  --  36  ALT  --  21  ALKPHOS  --  69  BILITOT  --  1.2  PROT  --  6.4*  ALBUMIN  --  3.2*  INR 1.14  --      INFECTIOUS  Recent Labs Lab 01/27/15 0922 01/28/15 0540  LATICACIDVEN 1.3 1.3     ENDOCRINE CBG (last 3)  No results for input(s): GLUCAP in the last 72 hours.       IMAGING x48h  - image(s) personally visualized  -   highlighted in bold No results found.     DISCUSSION: Chad Velazquez is a 74 y.o. male with severe COPD (FEV1 24% predicted in May 2016) who presents with acute on chronic hypoxic and hypercarbic respiratory failure in the setting of COPD exacerbation and MSSA bronchitis /CAP. Improved but now deconditioned to ECOG 4 and in need of rehab   ASSESSMENT / PLAN:  PULMONARY A: Acute hypoxic respiratory failure in setting of advanced COPD with chronic hypercarbia -  COPD exacerbation from MSSSA  infection versus lack of medications. He is now s/p extubation 01/27/15. Wheezing resolved   P:   - prednisone since 01/30/15 - 2 week taper - Antibiotics as below - Nebs q4h - dc lasix - sNF can do  This prn - o2 for usle ox > 88% - ORAL Morphine  for dyspnea - per palliative rec and per SN  CARDIOVASCULAR A:  NSTEMI, likely due to resp stress Shock, septic vs cardiogenic vs meds/sedation Not on pressors. Demand ischemia per cards. P:   -  Aspirin daily  RENAL A:   Chronic hypercarbic respiratory acidosis - stable. No renal issues otherwise. Off lasix since 01/30/15  P:   Monitor   GASTROINTESTINAL A:   Resoled epi pain Poor appoetitie Cachexia with moderate PCM   - severe protein calorie malnutrition  P nutn consult Start po diet PRN mylanta porotnix po BID  HEMATOLOGIC A:   Leukocytosis secondary to COPD exacerbation  P:  Monitor  INFECTIOUS Sputum 01/30/15 - MSSA  A:   COPD exacerbation - dyue to runing of meds and MSSA bronchitis (more likely) v CAP (less likelu) P:   Ceftriaxone and azithromycin started 12/17 pm > 12/21, doxy 12/21 >. (12/26)  NEUROLOGIC A:   ? Depressed - d/w pall care - unlikely Insomnia Class 4 dyspnea  P:   - scheduled restoril - xanax prn - morphine for dyspnea   GLOBALL A Very deconditioned  P SNF rehab and then home with hospice MMR will be hospice primary   FAMILY  - Updates: Updated patient 15/72/6203  And d/w Elmo Putt of Pall care -> he is hospice eligible. He accepted hospice 12/21/116 but 01/31/15 expressing desire to give attempt to get stronger to care for wife. Consult care mgmjt. Continue concurrent pall care + medical care + DNR  DC to snf rehab and then home with home hospice. MR hospice primary  > 30 min dc planning  Dr. Brand Males, M.D., [facility]C.P Pulmonary and Critical Care Medicine Staff Physician [facility] Pager: [phone], If no answer  or between  15:00h - 7:00h: call [phone]  02/01/2015 11:24 AM

## 2015-02-01 NOTE — Evaluation (Signed)
Physical Therapy Evaluation Patient Details Name: Chad Velazquez MRN: [id number] DOB: 09/12/1940 Today's Date: 02/01/2015   History of Present Illness  Pt adm with respiratory failure and required intubation. PMH - COPD,   Clinical Impression  Pt admitted with above diagnosis and presents to PT with functional limitations due to deficits listed below (See PT problem list). Pt needs skilled PT to maximize independence and safety to allow discharge to SNF for further rehab. Expect pt will make steady progress toward goals.     Follow Up Recommendations SNF    Equipment Recommendations  Rolling walker with 5" wheels    Recommendations for Other Services       Precautions / Restrictions Precautions Precautions: Fall Restrictions Weight Bearing Restrictions: No      Mobility  Bed Mobility Overal bed mobility: Needs Assistance Bed Mobility: Sidelying to Sit;Sit to Sidelying   Sidelying to sit: Min assist;HOB elevated     Sit to sidelying: Min assist;HOB elevated General bed mobility comments: Assist to elevate trunk into sitting and to bring feet back up into bed when returning to supine.  Transfers Overall transfer level: Needs assistance Equipment used: Rolling walker (2 wheeled) Transfers: Sit to/from Stand Sit to Stand: Mod assist         General transfer comment: Verbal cues for hand placement. Assist to bring hips up into standing and to control descent when sitting.  Ambulation/Gait Ambulation/Gait assistance: Min assist Ambulation Distance (Feet): 10 Feet Assistive device: Rolling walker (2 wheeled) Gait Pattern/deviations: Step-through pattern;Decreased step length - right;Decreased step length - left;Shuffle;Trunk flexed Gait velocity: decr Gait velocity interpretation: Below normal speed for age/gender General Gait Details: Assist for balance and support. Verbal cues to stand more erect Pt on 4L of O2 and dyspnea 3/4. Instructed in pursed lip  breathing.  Stairs            Wheelchair Mobility    Modified Rankin (Stroke Patients Only)       Balance Overall balance assessment: Needs assistance Sitting-balance support: No upper extremity supported;Feet supported Sitting balance-Leahy Scale: Fair     Standing balance support: Bilateral upper extremity supported Standing balance-Leahy Scale: Poor Standing balance comment: support of walker and min A                             Pertinent Vitals/Pain Pain Assessment: No/denies pain    Home Living Family/patient expects to be discharged to:: [place]: None      Prior Function Level of Independence: Independent               Hand Dominance        Extremity/Trunk Assessment   Upper Extremity Assessment: Generalized weakness           Lower Extremity Assessment: Generalized weakness         Communication   Communication: No difficulties  Cognition Arousal/Alertness: Awake/alert Behavior During Therapy: WFL for tasks assessed/performed Overall Cognitive Status: Within Functional Limits for tasks assessed                      General Comments      Exercises        Assessment/Plan    PT Assessment Patient needs continued PT services  PT Diagnosis Difficulty walking;Generalized weakness   PT Problem List Decreased strength;Decreased activity tolerance;Decreased balance;Decreased mobility;Decreased  knowledge of use of DME;Cardiopulmonary status limiting activity  PT Treatment Interventions DME instruction;Gait training;Functional mobility training;Therapeutic activities;Therapeutic exercise;Balance training;Patient/family education   PT Goals (Current goals can be found in the Care Plan section) Acute Rehab PT Goals Patient Stated Goal: Get stronger PT Goal Formulation: With patient Time For Goal Achievement: 02/15/15 Potential to Achieve Goals: Good     Frequency Min 2X/week   Barriers to discharge        Co-evaluation               End of Session Equipment Utilized During Treatment: Gait belt;Oxygen Activity Tolerance: Patient limited by fatigue Patient left: in bed;with call bell/phone within reach;with bed alarm set Nurse Communication: Mobility status         Time: 0812-0833 PT Time Calculation (min) (ACUTE ONLY): 21 min   Charges:   PT Evaluation $Initial PT Evaluation Tier I: 1 Procedure     PT G Codes:        Yamari Ventola 02/28/15, 8:44 AM Suanne Marker PT [phone]

## 2015-02-01 NOTE — Progress Notes (Signed)
Patient will DC to: [facility] Anticipated DC date: 02/01/15 Family notified: N/A Transport by: PTAR  CSW signing off.  Cedric Fishman, [place] Social Worker [phone]

## 2015-02-01 NOTE — Discharge Summary (Signed)
Physician Discharge Summary  Patient ID: Chad Velazquez MRN: [id number] DOB/AGE: Mar 22, 1940 74 y.o.  Admit date: 01/26/2015 Discharge date: 02/01/2015  Problem List Active Problems:   COPD exacerbation (HCC)   Acute respiratory failure with hypoxia (HCC)   Elevated troponin   Sinus tachycardia (HCC)   Protein-calorie malnutrition, severe   Palliative care encounter   Shortness of breath   MSSA (methicillin susceptible Staphylococcus aureus) infection  HPI: Chad Velazquez is a 74 y.o. male with advanced COPD who presents with shortness of breath. Per the ED staff, the patient reported 4 days of worsening shortness of breath after running out of his inhaler therapies for COPD. We are uncertain if he had fevers, chills, URI symptoms or changes in his sputum production. He received Methylprednisolone en route by EMS. In the ED, he was found to be hypoxic and hypercarbic. After a trial of BiPAP, the patient requested intubation due to continued dyspnea. He was transiently hypotensive after intubation and sedation but responded to fluid boluses. A CXR was done and revealed hyperinflated lungs, chronic scarring but no focal consolidations which were new. He reportedly had chest pain as well with EKG having concerns for ST segment elevation in V3-5. Cardiology was called but believe this is secondary to stress from his respiratory status. A POC troponin was negative in the ED.  Hospital Course:  Interval events / Subjective:  12/18 Tolerating some PSV now.  Slight residual wheeze  01/28/15 - extubated yesterday. Used BiPAP qhs. Pulse ox 100% on 4L [place]. Reports wheezing and epigastric pain. Denies this is chest pain RN reports bleeding around CVL site - On IV heparin. Cards reports demand ischemia. ECHO pending. Overall he is only 30% near baaseline and only some better since admit   12/20: O2 sat stable on 3L [place]; reports mild sob/sobe, anorexia and epigastric pain which he believes is acid  reflux. No further bleeding from CVL site.    01/30/15- asking for symptom oriented care. Thinks he would benefit from hospice. Thinks end is near. Says ambien lst night helped dyspnea and insomnia. No appetite. Says not to dc him home soon because he has no social support - wife is s/p layrngectomy. RN asking PT consult. RESP culture 12/17 - growing MSSA   01/31/2015 - morphine for dyspnea and restoril for insomnia helped a lot. RN said they assisted him to bathroom but most of time he is in bed. ECOG =4 . He accepted home hospice but does not want discharge untile after xmas. Prior to admission he was caring for his wife. Now he has no idea who is caring for her. . Suddenly he is changing his mind - says he wants to see if he can get his strenght improved so he can care for her. This is why he wanted to postpone dc. Seems wanting to explore inpatient rehab v LTAC v snf rehab   ASSESSMENT / PLAN:  PULMONARY A: Acute hypoxic respiratory failure in setting of advanced COPD with chronic hypercarbia - COPD exacerbation from MSSSA infection versus lack of medications. He is now s/p extubation 01/27/15. Wheezing resolved   P:  - prednisone since 01/30/15 - 2 week taper - Antibiotics as below - Nebs q4h - dc lasix - sNF can do This prn - o2 for usle ox > 88% - ORAL Morphine for dyspnea - per palliative rec and per SN  CARDIOVASCULAR A:  NSTEMI, likely due to resp stress Shock, septic vs cardiogenic vs meds/sedation Not on pressors. Demand  ischemia per cards. P:   - Aspirin daily  RENAL A:  Chronic hypercarbic respiratory acidosis - stable. No renal issues otherwise. Off lasix since 01/30/15 P:  Monitor   GASTROINTESTINAL A:  Resoled epi pain Poor appoetitie Cachexia with moderate PCM  - severe protein calorie malnutrition  P nutn consult Start po diet PRN mylanta porotnix po BID  HEMATOLOGIC A:  Leukocytosis secondary to COPD exacerbation  P:   Monitor  INFECTIOUS Sputum 01/30/15 - MSSA  A:  COPD exacerbation - dyue to runing of meds and MSSA bronchitis (more likely) v CAP (less likelu) P:  Ceftriaxone and azithromycin started 12/17 pm > 12/21, doxy 12/21 >. (12/26)  NEUROLOGIC A:  ? Depressed - d/w pall care - unlikely Insomnia Class 4 dyspnea  P:  - scheduled restoril - xanax prn - morphine for dyspnea   GLOBALL A Very deconditioned  P SNF rehab and then home with hospice MMR will be hospice primary   FAMILY  - Updates: Updated patient 62/69/4854 And d/w Chad Velazquez of Pall care -> he is hospice eligible. He accepted hospice 12/21/116 but 01/31/15 expressing desire to give attempt to get stronger to care for wife. Consult care mgmjt. Continue concurrent pall care + medical care + DNR  DC to snf rehab and then home with home hospice. MR hospice primary     Labs at discharge Lab Results  Component Value Date   CREATININE 0.76 02/01/2015   BUN 40* 02/01/2015   NA 132* 02/01/2015   K 4.2 02/01/2015   CL 85* 02/01/2015   CO2 39* 02/01/2015   Lab Results  Component Value Date   WBC 19.2* 02/01/2015   HGB 14.7 02/01/2015   HCT 44.6 02/01/2015   MCV 88.0 02/01/2015   PLT 206 02/01/2015   Lab Results  Component Value Date   ALT 21 01/26/2015   AST 36 01/26/2015   ALKPHOS 69 01/26/2015   BILITOT 1.2 01/26/2015   Lab Results  Component Value Date   INR 1.14 01/26/2015    Current radiology studies No results found.  Disposition:  06-Home-Health Care Svc  Discharge Instructions    Discharge to SNF when bed available    Complete by:  As directed             Medication List    STOP taking these medications        ibuprofen 200 MG tablet  Commonly known as:  ADVIL,MOTRIN     SPIRIVA HANDIHALER 18 MCG inhalation capsule  Generic drug:  tiotropium     VENTOLIN HFA 108 (90 BASE) MCG/ACT inhaler  Generic drug:  albuterol     Vitamin-B Complex Tabs      TAKE these  medications        ALPRAZolam 0.25 MG tablet  Commonly known as:  XANAX  Take 1 tablet (0.25 mg total) by mouth 2 (two) times daily as needed for anxiety.     alum & mag hydroxide-simeth 200-200-20 MG/5ML suspension  Commonly known as:  MAALOX/MYLANTA  Take 30 mLs by mouth every 6 (six) hours as needed for indigestion, heartburn or flatulence.     aspirin 81 MG chewable tablet  Chew 1 tablet (81 mg total) by mouth daily.     budesonide-formoterol 160-4.5 MCG/ACT inhaler  Commonly known as:  SYMBICORT  Inhale 2 puffs into the lungs 2 (two) times daily.     diltiazem 30 MG tablet  Commonly known as:  CARDIZEM  Take 1 tablet (30 mg total) by  mouth every 6 (six) hours.     doxycycline 100 MG tablet  Commonly known as:  VIBRA-TABS  Take 1 tablet (100 mg total) by mouth every 12 (twelve) hours.     ipratropium-albuterol 0.5-2.5 (3) MG/3ML Soln  Commonly known as:  DUONEB  Take 3 mLs by nebulization every 4 (four) hours as needed (SOB, WHEEZING, COUGHING).     morphine CONCENTRATE 10 MG/0.5ML Soln concentrated solution  Take 0.25 mLs (5 mg total) by mouth every hour as needed for severe pain or shortness of breath.     multivitamin with minerals Tabs tablet  Take 1 tablet by mouth daily.     predniSONE 10 MG tablet  Commonly known as:  DELTASONE  Take 0.5 tablets (5 mg total) by mouth daily.     temazepam 7.5 MG capsule  Commonly known as:  RESTORIL  Take 1 capsule (7.5 mg total) by mouth at bedtime.           Follow-up Information    Follow up with [facility] .   Specialty:  Skilled Nursing Facility   Contact information:   [address] [place] [phone]       Discharged Condition: poor  Time spent on discharge greater than 40 minutes.  Vital signs at Discharge. Temp:  [97.6 F (36.4 C)-98.5 F (36.9 C)] 97.6 F (36.4 C) (12/23 0515) Pulse Rate:  [93-102] 102 (12/23 0515) Resp:  [15-18] 16 (12/23 0515) BP:  (130-151)/(70-88) 130/87 mmHg (12/23 0515) SpO2:  [94 %-97 %] 96 % (12/23 0515) Weight:  [115 lb 15.4 oz (52.6 kg)] 115 lb 15.4 oz (52.6 kg) (12/23 0515) Office follow up Special Information or instructions. To go to SNF hopice Signed: Richardson Landry Minor ACNP Maryanna Shape PCCM Pager [phone] till 3 pm If no answer page [phone] 02/01/2015, 11:37 AM

## 2015-02-12 ENCOUNTER — Telehealth: Payer: Self-pay | Admitting: Internal Medicine

## 2015-02-12 NOTE — Telephone Encounter (Signed)
Spoke with Amy. They need to know if MR will be pt's attending for Hospice. They are also willing do symptom management if MR wants.  MR - please advise. Thanks.

## 2015-02-13 NOTE — Telephone Encounter (Signed)
Yes I can be hospice doc for MURPHY MARG. Actually we made a note of it in his progress note or dc summary in hospital

## 2015-02-13 NOTE — Telephone Encounter (Signed)
Spoke with Amy at [facility], aware of MR willing to be hospice doc.  Nothing further needed.

## 2015-02-18 ENCOUNTER — Telehealth: Payer: Self-pay | Admitting: Internal Medicine

## 2015-02-18 NOTE — Telephone Encounter (Signed)
Dr. Chase Caller are you okay with hospice doc signing DNR?

## 2015-02-19 NOTE — Telephone Encounter (Signed)
Spoke with Hospice. They are aware of MR's response. Nothing further was needed.

## 2015-02-19 NOTE — Telephone Encounter (Signed)
Yes ok for hospice doc to sign DNR

## 2015-02-25 ENCOUNTER — Inpatient Hospital Stay: Payer: Medicare PPO | Admitting: Adult Health

## 2015-04-17 ENCOUNTER — Ambulatory Visit: Payer: Medicare PPO | Admitting: Internal Medicine

## 2015-04-26 ENCOUNTER — Telehealth: Payer: Self-pay | Admitting: Internal Medicine

## 2015-04-26 NOTE — Telephone Encounter (Signed)
Spoke with Paul/Hospice - Needs Rx written for coverage purposes for Oxygen, Wheelchair and Gilford Rile which was given by Hospice upon d/c.  Eddie Dibbles states that they do not have a Rx on file and this is needed for insurance purposes - basically stating that the patient should maintain the use of O2, wheelchair and walker.  Dx COPD, HTN, Malnutrition Dr Melvyn Novas agreed to sign Rx in MR absence.  Eddie Dibbles states that this Rx needs to be faxed to [facility]  Will Hold Rx in triage to follow up 04/29/15 to ensure Rx received.

## 2015-04-29 NOTE — Telephone Encounter (Signed)
Encounter will be closed as this was handled on Friday 04/26/15

## 2015-07-05 ENCOUNTER — Telehealth: Payer: Self-pay | Admitting: Internal Medicine

## 2015-07-05 NOTE — Telephone Encounter (Signed)
[phone], pt cb

## 2015-07-05 NOTE — Telephone Encounter (Signed)
lmtcb x1 for pt. 

## 2015-07-05 NOTE — Telephone Encounter (Signed)
Spoke with pt and he states that [facility] is needing him to have qualifying O2 sats so that his ins will cover his O2. He has been paying out of pocket previously. Appt scheduled with TP for f/u and walk. Nothing further needed.

## 2015-07-16 ENCOUNTER — Telehealth: Payer: Self-pay | Admitting: Internal Medicine

## 2015-07-16 DIAGNOSIS — J438 Other emphysema: Secondary | ICD-10-CM

## 2015-07-16 NOTE — Telephone Encounter (Signed)
Spoke with pt. States that he needs an order sent to [facility] for his oxygen. I attempted to explain to him since we have not seen him since 2016 that his insurance will not cover his oxygen. Pt was very argumentative and rude. He demanded that we send this order regardless of insurance guidelines. Order has been placed for order needed.

## 2015-07-22 ENCOUNTER — Ambulatory Visit (INDEPENDENT_AMBULATORY_CARE_PROVIDER_SITE_OTHER): Payer: Medicare PPO | Admitting: Adult Health

## 2015-07-22 ENCOUNTER — Encounter: Payer: Self-pay | Admitting: Adult Health

## 2015-07-22 VITALS — BP 130/72 | HR 103 | Temp 98.1°F | Ht 73.0 in | Wt 126.0 lb

## 2015-07-22 DIAGNOSIS — J9611 Chronic respiratory failure with hypoxia: Secondary | ICD-10-CM | POA: Diagnosis not present

## 2015-07-22 DIAGNOSIS — J441 Chronic obstructive pulmonary disease with (acute) exacerbation: Secondary | ICD-10-CM | POA: Diagnosis not present

## 2015-07-22 MED ORDER — DOXYCYCLINE HYCLATE 100 MG PO TABS: 100.0000 mg | ORAL_TABLET | Freq: Two times a day (BID) | ORAL | Status: DC

## 2015-07-22 NOTE — Progress Notes (Signed)
Subjective:    Patient ID: Chad Velazquez, male    DOB: 02/08/1941, 75 y.o.   MRN: [id number]  HPI 75 yo male smoker with Emphysema /COPD   07/22/2015 Follow up : COPD  Pt returns for year follow up for COPD  Remains on Symbicort and Spiriva .  His DME has requested an O2 qualifiying walk in order to keep his oxygen. Walk test in office with O2 sats 88% on RA walking . O2 sats >90% on 2l/m .   Pt c/o SOB with exertion is chronic . Does not have much energy.  No flare of cough or wheezing.  Was admitted 01/2015 with COPD exacerbation and acute on chronic resp failrue requiring vent support.  Says he has had increased cough and congestion for last couple of weeks. Worried this might turn into a bronchitis.  Denies fever, chest pain or increased edema.    Past Medical History  Diagnosis Date  . Emphysema   . Abnormal chest CT   . Pneumonia   . COPD (chronic obstructive pulmonary disease) ([place])    Current Outpatient Prescriptions on File Prior to Visit  Medication Sig Dispense Refill  . ALPRAZolam (XANAX) 0.25 MG tablet Take 1 tablet (0.25 mg total) by mouth 2 (two) times daily as needed for anxiety. 30 tablet 0  . alum & mag hydroxide-simeth (MAALOX/MYLANTA) 200-200-20 MG/5ML suspension Take 30 mLs by mouth every 6 (six) hours as needed for indigestion, heartburn or flatulence. 355 mL 0  . aspirin 81 MG chewable tablet Chew 1 tablet (81 mg total) by mouth daily.    . budesonide-formoterol (SYMBICORT) 160-4.5 MCG/ACT inhaler Inhale 2 puffs into the lungs 2 (two) times daily. 1 Inhaler 3  . Multiple Vitamin (MULITIVITAMIN WITH MINERALS) TABS Take 1 tablet by mouth daily.    . predniSONE (DELTASONE) 10 MG tablet Take 0.5 tablets (5 mg total) by mouth daily. 30 tablet 3  . temazepam (RESTORIL) 7.5 MG capsule Take 1 capsule (7.5 mg total) by mouth at bedtime. 30 capsule 0  . diltiazem (CARDIZEM) 30 MG tablet Take 1 tablet (30 mg total) by mouth every 6 (six) hours. (Patient not taking:  Reported on 07/22/2015)    . doxycycline (VIBRA-TABS) 100 MG tablet Take 1 tablet (100 mg total) by mouth every 12 (twelve) hours. (Patient not taking: Reported on 07/22/2015)    . ipratropium-albuterol (DUONEB) 0.5-2.5 (3) MG/3ML SOLN Take 3 mLs by nebulization every 4 (four) hours as needed (SOB, WHEEZING, COUGHING). (Patient not taking: Reported on 07/22/2015) 360 mL 0  . Morphine Sulfate (MORPHINE CONCENTRATE) 10 MG/0.5ML SOLN concentrated solution Take 0.25 mLs (5 mg total) by mouth every hour as needed for severe pain or shortness of breath. (Patient not taking: Reported on 07/22/2015) 42 mL 0   No current facility-administered medications on file prior to visit.  '   Review of Systems Constitutional:   No  weight loss, night sweats,  Fevers, chills, + fatigue, or  lassitude.  HEENT:   No headaches,  Difficulty swallowing,  Tooth/dental problems, or  Sore throat,                No sneezing, itching, ear ache, nasal congestion, post nasal drip,   CV:  No chest pain,  Orthopnea, PND, swelling in lower extremities, anasarca, dizziness, palpitations, syncope.   GI  No heartburn, indigestion, abdominal pain, nausea, vomiting, diarrhea, change in bowel habits, loss of appetite, bloody stools.   Resp:    No chest wall deformity  Skin: no rash or lesions.  GU: no dysuria, change in color of urine, no urgency or frequency.  No flank pain, no hematuria   MS:  No joint pain or swelling.  No decreased range of motion.  No back pain.  Psych:  No change in mood or affect. No depression or anxiety.  No memory loss.         Objective:   Physical Exam   Filed Vitals:   07/22/15 1139  BP: 130/72  Pulse: 103  Temp: 98.1 F (36.7 C)  TempSrc: Oral  Height: 6\' 1"  (1.854 m)  Weight: 126 lb (57.153 kg)  SpO2: 97%    GEN: A/Ox3; pleasant , NAD, elderly and frail   HEENT:  [place]/AT,  EACs-clear, TMs-wnl, NOSE-clear, THROAT-clear, no lesions, no postnasal drip or exudate noted.   NECK:   Supple w/ fair ROM; no JVD; normal carotid impulses w/o bruits; no thyromegaly or nodules palpated; no lymphadenopathy.  RESP  Decreased BS in bases , no accessory muscle use, no dullness to percussion  CARD:  RRR, no m/r/g  , no peripheral edema, pulses intact, no cyanosis or clubbing.  GI:   Soft & nt; nml bowel sounds; no organomegaly or masses detected.  Musco: Warm bil, no deformities or joint swelling noted.   Neuro: alert, no focal deficits noted.    Skin: Warm, no lesions or rashes  Ervan Heber NP-C  [facility]  07/22/2015

## 2015-07-22 NOTE — Patient Instructions (Addendum)
Continue on Symbicort and Spiriva .  Rinse after inhaler use.  Continue on  Oxygen 2l/m with activity.  Work on not smoking  Doxycyline to have on hold if cough /congestion worsen with discolored mucus.  Follow up in 6 months with Dr. Melvyn Novas  And As needed

## 2015-07-25 DIAGNOSIS — J9610 Chronic respiratory failure, unspecified whether with hypoxia or hypercapnia: Secondary | ICD-10-CM | POA: Insufficient documentation

## 2015-07-25 NOTE — Assessment & Plan Note (Signed)
?  mild flare  abx to have if sputum worsens   Plan  Continue on Symbicort and Spiriva .  Rinse after inhaler use.  Continue on  Oxygen 2l/m with activity.  Work on not smoking  Doxycyline to have on hold if cough /congestion worsen with discolored mucus.

## 2015-07-25 NOTE — Assessment & Plan Note (Signed)
Continue on  Oxygen 2l/m with activity.  Work on not smoking

## 2015-08-05 ENCOUNTER — Telehealth: Payer: Self-pay | Admitting: Internal Medicine

## 2015-08-05 ENCOUNTER — Encounter: Payer: Self-pay | Admitting: Adult Health

## 2015-08-05 NOTE — Telephone Encounter (Signed)
LVM for pt to return call

## 2015-08-07 MED ORDER — PREDNISONE 10 MG PO TABS: 5.0000 mg | ORAL_TABLET | Freq: Every day | ORAL | Status: DC

## 2015-08-07 NOTE — Telephone Encounter (Signed)
Spoke with pt. He needs a refill on prednisone. Rx has been sent in. Nothing further was needed.

## 2015-08-09 ENCOUNTER — Telehealth: Payer: Self-pay | Admitting: Internal Medicine

## 2015-08-09 DIAGNOSIS — J438 Other emphysema: Secondary | ICD-10-CM

## 2015-08-09 DIAGNOSIS — R0602 Shortness of breath: Secondary | ICD-10-CM

## 2015-08-09 DIAGNOSIS — J9611 Chronic respiratory failure with hypoxia: Secondary | ICD-10-CM

## 2015-08-09 NOTE — Telephone Encounter (Signed)
Order has been placed. LVM for pt to return call.

## 2015-08-09 NOTE — Telephone Encounter (Signed)
That is fine for POC  

## 2015-08-09 NOTE — Telephone Encounter (Signed)
Called spoke with pt. He states that he would like an evaulation for a POC. He states that he needs something smaller and light weight to carry. I explained to him that I would send a message to MR for his approval to send POC eval order to [place]. He voiced understanding and had no further questions.   MR please advise

## 2015-08-09 NOTE — Telephone Encounter (Signed)
Spoke with pt and advised that POC eval order has been sent to [place]. Pt is also asking for them to take over his O2 tanks/supplies. Pt states that he has been purchasing this out of pocket previously as he was not using much. Now that use has increased he would like them to take over. Order sent to [facility] for new O2. Nothing further needed.

## 2015-08-09 NOTE — Telephone Encounter (Signed)
Pt returning call. Please call him back at [phone]

## 2015-08-12 ENCOUNTER — Telehealth: Payer: Self-pay | Admitting: Internal Medicine

## 2015-08-12 NOTE — Telephone Encounter (Signed)
Called Chad Velazquez, states that they are faxing over order forms for portable O2 to be signed. Will send to [facility] to keep an eye out for this form - rep states that this is being faxed right now.

## 2015-08-14 ENCOUNTER — Telehealth: Payer: Self-pay | Admitting: Internal Medicine

## 2015-08-14 NOTE — Telephone Encounter (Signed)
I have checked MW's look at and up front and this form is not in either location. Magda Paganini do you know if MW has received this form? Thanks.

## 2015-08-14 NOTE — Telephone Encounter (Signed)
Called and spoke with Ruby at [facility]. I explained to her that [facility] had called stating that the pt needs a new order. She states that they received the order and the oxygen is going to be delivered to the patient's house today or tomorrow. She stated they needed nothing further.She voiced understanding and had no further questions.

## 2015-08-14 NOTE — Telephone Encounter (Signed)
Called [employer] and spoke with [place]. This order is going to be refaxed. Will hold message open until this is received.

## 2015-08-14 NOTE — Telephone Encounter (Signed)
I have not seen anything, sorry

## 2015-08-15 NOTE — Telephone Encounter (Signed)
This form still can't be located. I have checked MW's look at and up front.  MW - please advise if you have seen this order. Thanks.

## 2015-08-19 NOTE — Telephone Encounter (Signed)
Attempted to contact Chad Velazquez. I was placed on a long hold when calling the [phone] number. Called Carol at the [facility] location, there was no answer. Will try back.

## 2015-08-20 NOTE — Telephone Encounter (Signed)
Called and spoke with [place] at [facility]. States that this form was received yesterday with MW's signature. Nothing further was needed at this time.

## 2015-09-11 ENCOUNTER — Telehealth: Payer: Self-pay | Admitting: Internal Medicine

## 2015-09-11 NOTE — Telephone Encounter (Signed)
Pt is requesting a refill on Spiriva HH, prednisone (takes 5mg  qd), symbicort 160, and restoril 7.5mg  qhs.   Pt uses [facility].   Pt is a former [facility] pt that is scheduled to see MW in 01/2016.  Sending refill request to TP as she was the last to see pt.    TP please advise on refill request, particularly restoril.  Thanks!

## 2015-09-12 MED ORDER — TEMAZEPAM 7.5 MG PO CAPS: 7.5000 mg | ORAL_CAPSULE | Freq: Every day | ORAL | 0 refills | Status: DC

## 2015-09-12 MED ORDER — PREDNISONE 10 MG PO TABS: 5.0000 mg | ORAL_TABLET | Freq: Every day | ORAL | 5 refills | Status: DC

## 2015-09-12 MED ORDER — BUDESONIDE-FORMOTEROL FUMARATE 160-4.5 MCG/ACT IN AERO: 2.0000 | INHALATION_SPRAY | Freq: Two times a day (BID) | RESPIRATORY_TRACT | 5 refills | Status: DC

## 2015-09-12 MED ORDER — SPIRIVA HANDIHALER 18 MCG IN CAPS: 1.0000 | ORAL_CAPSULE | Freq: Every day | RESPIRATORY_TRACT | 5 refills | Status: DC

## 2015-09-12 NOTE — Telephone Encounter (Signed)
Per pt's chart, WE last filled the Restoril on 12.23.16 by Richardson Landry at discharge Called [facility] and spoke with pharmacist Benjamine Mola who reported that all of these medications (the Restoril, Spiriva HH, Symbicort 160 and prednisone) have been filled by Dr. Jenene Slicker with [facility] regularly in the past.  She will fax the refill requests to that office.  LMOM TCB x1 to inform pt of the above.

## 2015-09-12 NOTE — Telephone Encounter (Signed)
That is fine for refills x 5 for spiriva , symbicort and pred .  Did we refill restoril in past ?

## 2015-09-12 NOTE — Telephone Encounter (Signed)
Spoke with the pt and notified of recs per TP and he verbalized understanding  I have called refill to [facility]  Nothing further needed

## 2015-09-12 NOTE — Telephone Encounter (Signed)
Can fill restoril x 1 only and then will need to get additonal rx from PCP going forward .  Please contact office for sooner follow up if symptoms do not improve or worsen or seek emergency care

## 2015-09-12 NOTE — Telephone Encounter (Signed)
Pt states that he was [facility] from [facility] back in March - pt states that his meds are no longer being supplied by Dr Jenene Slicker. Pt aware that the Spiriva, Symbicort and Prednisone has been refilled to the pharmacy x 5 refills.  Pt is requesting that Dr Chase Caller takes over prescribing the Restoril - pt has recall in the system to see MR in Dec 2017.  Restoril last refilled by Dr Jenene Slicker 08/02/15 for 15mg  - pt does not use every day, uses PRN for sleep. Per EPIC, the last refill that we have on file is for 02/01/2015 for 7.5mg  dose. Per the Pharmacist at [facility]), Dr [facility] increased his dose from 7.5mg  to 15mg .  Please advise TP if you are able to refill the Restoril. Pt very anxious about getting his medications filled today and states that if there is an issue with filling this, he requests that Tammy Parrett call him. Thanks.

## 2015-09-16 ENCOUNTER — Telehealth: Payer: Self-pay | Admitting: Adult Health

## 2015-09-16 DIAGNOSIS — G4700 Insomnia, unspecified: Secondary | ICD-10-CM

## 2015-09-16 NOTE — Telephone Encounter (Signed)
PA initiated through [facility] for Temazepam 7.5mg   The request has received a Pending outcome. Please note any additional information provided by [facility] at the bottom of your screen. You will receive a final determination electronically in CoverMyMeds and via email and fax within 24 to 72 hour  Awaiting response.

## 2015-09-17 DIAGNOSIS — G4700 Insomnia, unspecified: Secondary | ICD-10-CM | POA: Insufficient documentation

## 2015-09-17 NOTE — Telephone Encounter (Signed)
Received fax from [facility] requesting additional information on patient's diagnosis. Called [employer] at [phone] Gave them information requested. Awaiting response.

## 2015-09-17 NOTE — Telephone Encounter (Signed)
This request has received an Unfavorable outcome. Please note any additional information provided by [facility] at the bottom of your screen. You will also receive a faxed copy of the determination  Awaiting fax for appeal information or alternative med.

## 2015-10-02 NOTE — Telephone Encounter (Signed)
Temazepam denied. Alternatives given are Trazodone, zolpidem, zaleplon capsule, or belsomra.

## 2015-10-10 NOTE — Telephone Encounter (Signed)
Per verbal order for TP  Pt will need to follow up with PCP about PA and refills.  Called and notified pharmacy of the above. LVM for pt to return call to notify him of the above.

## 2015-10-10 NOTE — Telephone Encounter (Signed)
[id number] pt calling back

## 2015-10-11 NOTE — Telephone Encounter (Signed)
lmtcb x2 

## 2015-10-16 ENCOUNTER — Telehealth: Payer: Self-pay | Admitting: Adult Health

## 2015-10-16 NOTE — Telephone Encounter (Signed)
Patient states that he has a prescription for Doxycycline. Patient wants to talk to Tammy about getting on the Doxycycline.  He said that he told Tammy he would call before taking the Doxycyline.  He wanted to know if it was okay for him to take it now.  He said that his sputum is starting to change color now to yellow.    Patient also wants to know if he needs to go ahead and schedule his appointment (follow up in Dec) with MR or does TP want to see him again before December?  TP - please advise. (Pt aware that TP is out of the office until tomorrow and okay waiting for tomorrow)

## 2015-10-17 NOTE — Telephone Encounter (Signed)
That is fine to take Doxycycline if he is having a COPD flare with increased cough and congestion .  Please contact office for sooner follow up if symptoms do not improve or worsen or seek emergency care  follow up can be with me or Dr. Chase Caller in Dec as planned . If he is not improving I can work him in next week to be seen any time.

## 2015-10-17 NOTE — Telephone Encounter (Signed)
Called spoke with pt. Reviewed TP's results and recs. He voiced understanding and had no further questions.

## 2016-01-21 ENCOUNTER — Encounter: Payer: Self-pay | Admitting: Adult Health

## 2016-01-21 ENCOUNTER — Ambulatory Visit (INDEPENDENT_AMBULATORY_CARE_PROVIDER_SITE_OTHER): Payer: Medicare PPO | Admitting: Adult Health

## 2016-01-21 DIAGNOSIS — J9611 Chronic respiratory failure with hypoxia: Secondary | ICD-10-CM | POA: Diagnosis not present

## 2016-01-21 DIAGNOSIS — J438 Other emphysema: Secondary | ICD-10-CM | POA: Diagnosis not present

## 2016-01-21 DIAGNOSIS — Z23 Encounter for immunization: Secondary | ICD-10-CM

## 2016-01-21 DIAGNOSIS — E43 Unspecified severe protein-calorie malnutrition: Secondary | ICD-10-CM | POA: Diagnosis not present

## 2016-01-21 MED ORDER — PREDNISONE 10 MG PO TABS: 5.0000 mg | ORAL_TABLET | Freq: Every day | ORAL | 5 refills | Status: DC

## 2016-01-21 MED ORDER — SPIRIVA HANDIHALER 18 MCG IN CAPS: 1.0000 | ORAL_CAPSULE | Freq: Every day | RESPIRATORY_TRACT | 5 refills | Status: DC

## 2016-01-21 MED ORDER — VENTOLIN HFA 108 (90 BASE) MCG/ACT IN AERS: 2.0000 | INHALATION_SPRAY | RESPIRATORY_TRACT | 5 refills | Status: DC | PRN

## 2016-01-21 MED ORDER — DOXYCYCLINE HYCLATE 100 MG PO TABS: 100.0000 mg | ORAL_TABLET | Freq: Two times a day (BID) | ORAL | 1 refills | Status: DC

## 2016-01-21 MED ORDER — BUDESONIDE-FORMOTEROL FUMARATE 160-4.5 MCG/ACT IN AERO: 2.0000 | INHALATION_SPRAY | Freq: Two times a day (BID) | RESPIRATORY_TRACT | 5 refills | Status: DC

## 2016-01-21 NOTE — Assessment & Plan Note (Signed)
Nutrition , diet disucssed .

## 2016-01-21 NOTE — Assessment & Plan Note (Signed)
Copd compensated on present regimen  No changes  Plan  Patient Instructions  Flu shot today .  Continue on Symbicort and Spiriva .  Rinse after inhaler use.  Continue on  Oxygen 2l/m with activity and At bedtime   Work on not smoking  Doxycyline to have on hold if cough /congestion worsen with discolored mucus.  Follow up in 6 months with Dr. Chase Caller and As needed      '

## 2016-01-21 NOTE — Addendum Note (Signed)
Addended by: Doroteo Glassman D on: 01/21/2016 01:48 PM   Modules accepted: Orders

## 2016-01-21 NOTE — Assessment & Plan Note (Signed)
Compensated on O2  

## 2016-01-21 NOTE — Patient Instructions (Addendum)
Flu shot today .  Continue on Symbicort and Spiriva .  Rinse after inhaler use.  Continue on  Oxygen 2l/m with activity and At bedtime   Work on not smoking  Doxycyline to have on hold if cough /congestion worsen with discolored mucus.  Follow up in 6 months with Dr. Chase Caller and As needed

## 2016-01-21 NOTE — Progress Notes (Signed)
Subjective:    Patient ID: Chad Velazquez, male    DOB: November 14, 1940, 75 y.o.   MRN: [id number]  HPI 75 yo male smoker with Emphysema /COPD   TEST  Quit smoking 10/2010 Started back smoking 2013 Does not want to participate in pulmonary rehab. ONO 2013:  OK  [place] 06/2014:  FEV1 0.90 (24%), ratio 34, moderate effort at best.  Ambulatory Ox 06/2014:  Ok   Severe COPD exacerbation 01/2015 hospital admit /vent support.   01/21/2016 Follow up : COPD  Pt returns for 6 months follow up for COPD .  He is says he feels is doing better w/ flares of cough /wheezing.  Remains on Symbiocrt and Spiriva and prednisone 5mg  daily   Chronic resp failure on O2 with act and At bedtime   Sats doing well at home.   Pt is no longer in hospice . Says he was discharged.   He remains thin . Discussed diet and nutrition.   He denies chest pain, orthopnea , edema or fever.     Past Medical History:  Diagnosis Date  . Abnormal chest CT   . COPD (chronic obstructive pulmonary disease) ([place])   . Emphysema   . Pneumonia    Current Outpatient Prescriptions on File Prior to Visit  Medication Sig Dispense Refill  . Acetaminophen 325 MG CAPS Take 1 capsule by mouth as needed.    . ALPRAZolam (XANAX) 0.25 MG tablet Take 1 tablet (0.25 mg total) by mouth 2 (two) times daily as needed for anxiety. 30 tablet 0  . alum & mag hydroxide-simeth (MAALOX/MYLANTA) 200-200-20 MG/5ML suspension Take 30 mLs by mouth every 6 (six) hours as needed for indigestion, heartburn or flatulence. 355 mL 0  . budesonide-formoterol (SYMBICORT) 160-4.5 MCG/ACT inhaler Inhale 2 puffs into the lungs 2 (two) times daily. 1 Inhaler 5  . diltiazem (CARDIZEM) 30 MG tablet Take 1 tablet (30 mg total) by mouth every 6 (six) hours.    Marland Kitchen ibuprofen (ADVIL,MOTRIN) 200 MG tablet Take 200 mg by mouth every 6 (six) hours as needed.    . Multiple Vitamin (MULITIVITAMIN WITH MINERALS) TABS Take 1 tablet by mouth daily.    . predniSONE (DELTASONE)  10 MG tablet Take 0.5 tablets (5 mg total) by mouth daily. 30 tablet 5  . senna (SENOKOT) 8.6 MG tablet Take 1 tablet by mouth daily.    Marland Kitchen SPIRIVA HANDIHALER 18 MCG inhalation capsule Place 1 capsule (18 mcg total) into inhaler and inhale daily. 30 capsule 5  . temazepam (RESTORIL) 7.5 MG capsule Take 1 capsule (7.5 mg total) by mouth at bedtime. 30 capsule 0  . aspirin 81 MG chewable tablet Chew 1 tablet (81 mg total) by mouth daily. (Patient not taking: Reported on 01/21/2016)    . chlorpheniramine (CHLOR-TRIMETON) 4 MG tablet Take 4 mg by mouth every 6 (six) hours as needed for allergies.    Marland Kitchen doxycycline (VIBRA-TABS) 100 MG tablet Take 1 tablet (100 mg total) by mouth every 12 (twelve) hours. (Patient not taking: Reported on 01/21/2016)    . ipratropium-albuterol (DUONEB) 0.5-2.5 (3) MG/3ML SOLN Take 3 mLs by nebulization every 4 (four) hours as needed (SOB, WHEEZING, COUGHING). (Patient not taking: Reported on 01/21/2016) 360 mL 0  . tamsulosin (FLOMAX) 0.4 MG CAPS capsule Take 1 capsule by mouth daily.    . VENTOLIN HFA 108 (90 Base) MCG/ACT inhaler Inhale 2 puffs into the lungs every 4 (four) hours as needed.     No current facility-administered medications on  file prior to visit.   '   Review of Systems Constitutional:   No  weight loss, night sweats,  Fevers, chills, + fatigue, or  lassitude.  HEENT:   No headaches,  Difficulty swallowing,  Tooth/dental problems, or  Sore throat,                No sneezing, itching, ear ache, nasal congestion, post nasal drip,   CV:  No chest pain,  Orthopnea, PND, swelling in lower extremities, anasarca, dizziness, palpitations, syncope.   GI  No heartburn, indigestion, abdominal pain, nausea, vomiting, diarrhea, change in bowel habits, loss of appetite, bloody stools.   Resp:    No chest wall deformity  Skin: no rash or lesions.  GU: no dysuria, change in color of urine, no urgency or frequency.  No flank pain, no hematuria   MS:  No joint  pain or swelling.  No decreased range of motion.  No back pain.  Psych:  No change in mood or affect. No depression or anxiety.  No memory loss.         Objective:   Physical Exam   Vitals:   01/21/16 1148  BP: 120/70  Pulse: 100  Temp: 97.6 F (36.4 C)  TempSrc: Oral  SpO2: 97%  Weight: 126 lb 9.6 oz (57.4 kg)  Height: 6' 1.5" (1.867 m)   GEN: A/Ox3; pleasant , NAD, thin and elderly    HEENT:  [place]/AT,  EACs-clear, TMs-wnl, NOSE-clear, THROAT-clear, no lesions, no postnasal drip or exudate noted.   NECK:  Supple w/ fair ROM; no JVD; normal carotid impulses w/o bruits; no thyromegaly or nodules palpated; no lymphadenopathy.    RESP  Decrease BS in bases  no accessory muscle use, no dullness to percussion  CARD:  RRR, no m/r/g  , no peripheral edema, pulses intact, no cyanosis or clubbing.  GI:   Soft & nt; nml bowel sounds; no organomegaly or masses detected.   Musco: Warm bil, no deformities or joint swelling noted.   Neuro: alert, no focal deficits noted.    Skin: Warm, no lesions or rashes   Tammy Parrett NP-C  [facility]  /01/21/2016

## 2016-06-08 ENCOUNTER — Telehealth: Payer: Self-pay | Admitting: Internal Medicine

## 2016-06-08 NOTE — Telephone Encounter (Signed)
Spoke with pt, states that his insurance company sends a NP to his home for evaluation- it was suggested that his prednisone be increased from 5mg  qd to 10mg  qd.  Pt states that 5mg  is "becoming ineffective" for him.  Per pt, he has discussed increasing prednisone to 10mg  with MR in the past.   Pt requesting a response by 06/09/2016 d/t only having 2 tabs left and no refills at the pharmacy. Pt uses [facility].  MR please advise- ok to increase prednisone to 10mg ?  Thanks.

## 2016-06-11 MED ORDER — PREDNISONE 10 MG PO TABS: 10.0000 mg | ORAL_TABLET | Freq: Every day | ORAL | 5 refills | Status: AC

## 2016-06-11 NOTE — Telephone Encounter (Signed)
Please clarify why he is asking for prednisone. It looks as if he took it for awhile back during the winter, but I can't tell that it has been a maintenance medicine.

## 2016-06-11 NOTE — Telephone Encounter (Signed)
Patient returned phone call.Erica R Taylor ° °

## 2016-06-11 NOTE — Telephone Encounter (Signed)
Spoke with pt, requesting a refill today on 10mg  prednisone.  Pt states he had taken 10mg  in the past, wishes for an updated rx for 10mg  to be sent in today.  Pt is out of prednisone.  Sending to DOD as MR is unavailable today. CY please advise if ok to send in Prednisone 10mg .  Thanks.

## 2016-06-11 NOTE — Telephone Encounter (Signed)
Thanks for clarifying  Pk prednisone 10 mg, # 30, 1 daily, ref x 5

## 2016-06-11 NOTE — Telephone Encounter (Signed)
Spoke with the pt and notified of recs per CY  He verbalized understanding  Rx was sent to pharm

## 2016-06-11 NOTE — Telephone Encounter (Signed)
Spoke with the pt  He states he has been on pred 5 mg "for years" and approx 2 months ago his NP that does home visits rec that he increase to 10 mg due to increased SOB. He has been taking the 10 mg since then and breathing is back at his normal baseline. He wants to continue this dose and is almost out of medicine. Please advise thanks!

## 2016-07-02 IMAGING — CR DG CHEST 1V PORT
1 series · 1 of 1 positions shown · non-contrast
Comparison: Yesterday at 9151 hour

CLINICAL DATA: Central line placement.

EXAM:
PORTABLE CHEST 1 VIEW

[AP]
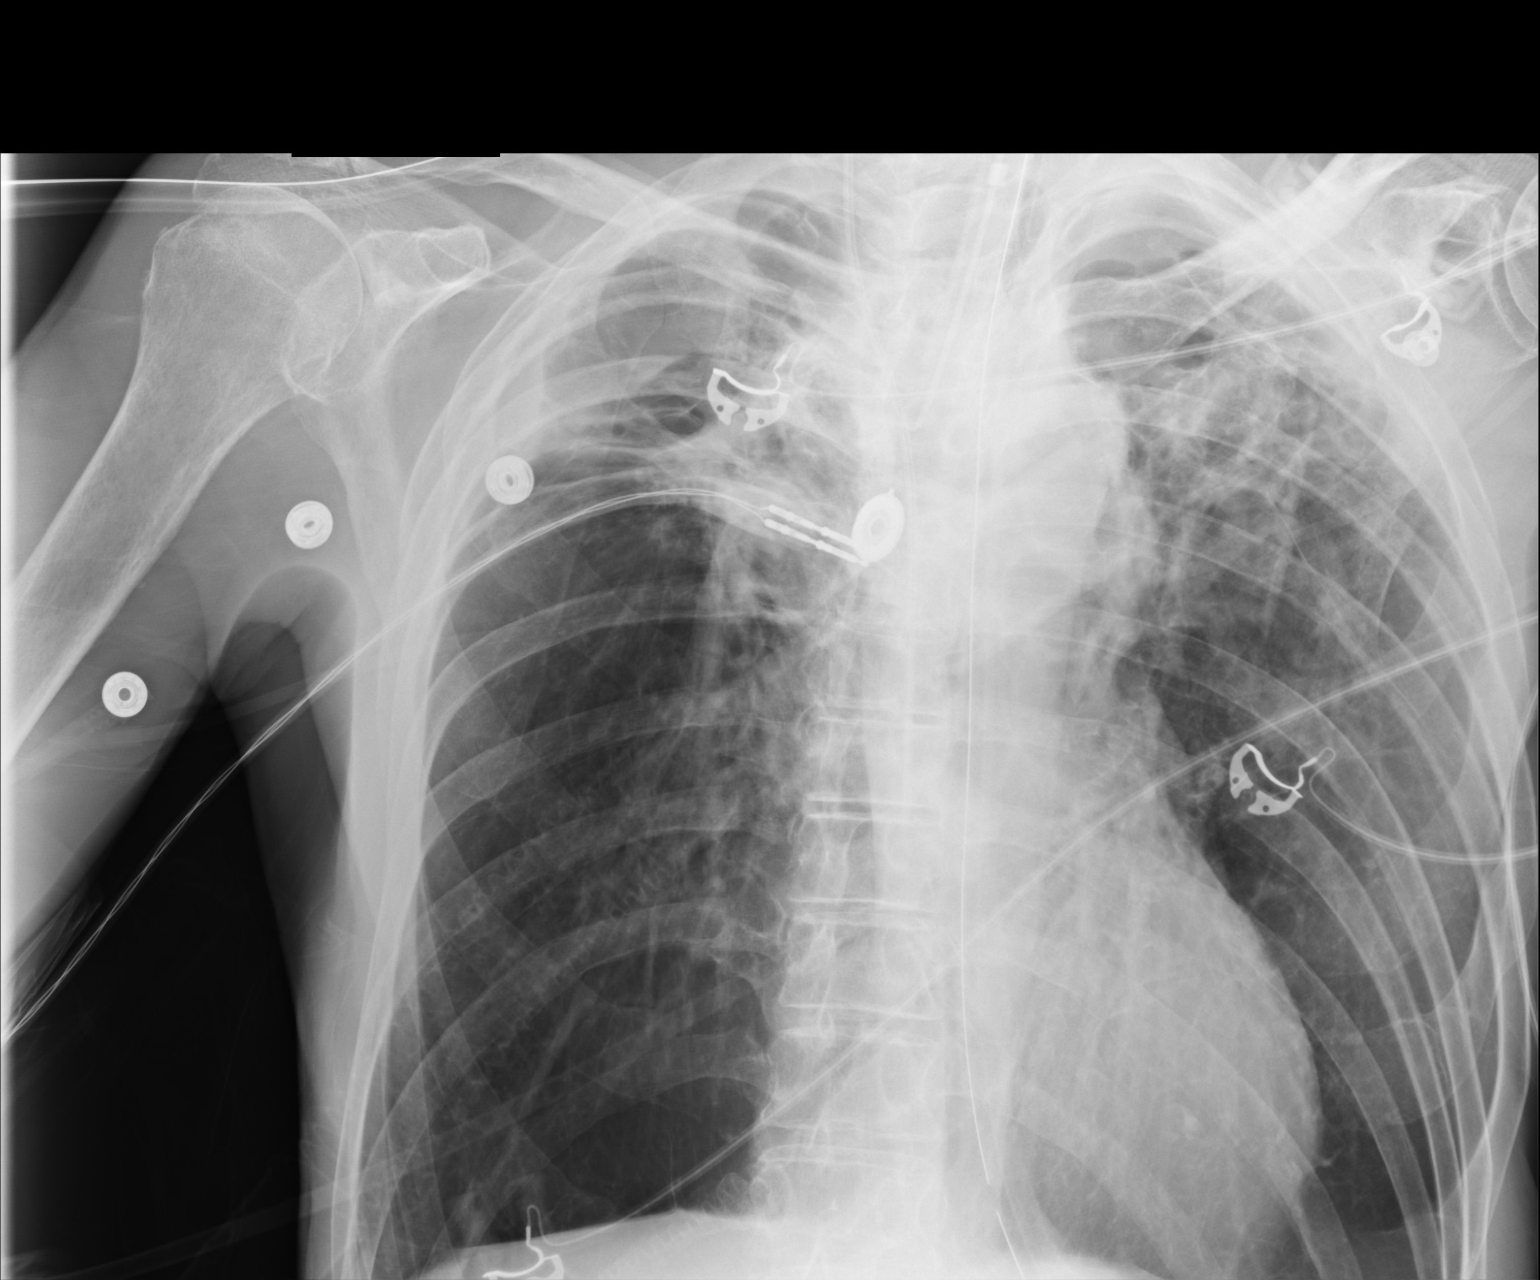

[1 of 1 positions shown; findings below may reference images not displayed]

FINDINGS: Tip of the right central line projects over the region of the mid
SVC. No evidence of pneumothorax. Endotracheal tube at the thoracic
inlet. Enteric tube in place, side-port in the region of the distal
esophagus. Lungs remain hyperinflated with emphysema and biapical
pleural parenchymal scarring.
IMPRESSION: 1. Tip of the right central line in the mid SVC.  No pneumothorax.
2. Side port of the enteric tube remains in the distal esophagus.
Advancement recommended.
3. Hyperinflation with emphysema and biapical pleural parenchymal
scarring.

## 2016-07-07 ENCOUNTER — Other Ambulatory Visit: Payer: Self-pay | Admitting: Adult Health

## 2016-08-26 ENCOUNTER — Telehealth: Payer: Self-pay | Admitting: Internal Medicine

## 2016-08-26 ENCOUNTER — Other Ambulatory Visit: Payer: Self-pay | Admitting: Adult Health

## 2016-08-26 MED ORDER — SPIRIVA HANDIHALER 18 MCG IN CAPS: 1.0000 | ORAL_CAPSULE | Freq: Every day | RESPIRATORY_TRACT | 2 refills | Status: DC

## 2016-08-26 NOTE — Telephone Encounter (Signed)
Spoke with pt and he is aware that he is due for a follow up appt, which we made over the phone. Rx was sent in to his pharmacy of choice. He had no further questions at this time. Nothing further is needed

## 2016-10-05 ENCOUNTER — Other Ambulatory Visit: Payer: Self-pay | Admitting: Adult Health

## 2016-10-20 ENCOUNTER — Ambulatory Visit: Payer: Medicare PPO | Admitting: Internal Medicine

## 2016-10-27 ENCOUNTER — Ambulatory Visit: Payer: Medicare PPO | Admitting: Internal Medicine

## 2016-11-03 ENCOUNTER — Other Ambulatory Visit: Payer: Self-pay | Admitting: Adult Health

## 2016-11-04 ENCOUNTER — Telehealth: Payer: Self-pay | Admitting: Internal Medicine

## 2016-11-04 MED ORDER — VENTOLIN HFA 108 (90 BASE) MCG/ACT IN AERS: 2.0000 | INHALATION_SPRAY | RESPIRATORY_TRACT | 5 refills | Status: DC | PRN

## 2016-11-04 MED ORDER — BUDESONIDE-FORMOTEROL FUMARATE 160-4.5 MCG/ACT IN AERO: 2.0000 | INHALATION_SPRAY | Freq: Two times a day (BID) | RESPIRATORY_TRACT | 2 refills | Status: DC

## 2016-11-04 MED ORDER — SPIRIVA HANDIHALER 18 MCG IN CAPS: 1.0000 | ORAL_CAPSULE | Freq: Every day | RESPIRATORY_TRACT | 2 refills | Status: DC

## 2016-11-04 NOTE — Telephone Encounter (Signed)
Called and spoke with pt  And he stated that he has recently had cataract surgery and that he was not able to come in until Nov since he will have another surgery in oct.  Refills have been sent in to the pharmacy to last until November.

## 2016-11-05 ENCOUNTER — Other Ambulatory Visit: Payer: Self-pay | Admitting: Adult Health

## 2016-12-07 ENCOUNTER — Telehealth: Payer: Self-pay | Admitting: Internal Medicine

## 2016-12-07 MED ORDER — SPIRIVA HANDIHALER 18 MCG IN CAPS: 1.0000 | ORAL_CAPSULE | Freq: Every day | RESPIRATORY_TRACT | 1 refills | Status: AC

## 2016-12-07 NOTE — Telephone Encounter (Signed)
Spoke with pt and advised rx sent to pharmacy  

## 2016-12-23 ENCOUNTER — Ambulatory Visit: Payer: Medicare PPO | Admitting: Internal Medicine

## 2016-12-23 ENCOUNTER — Encounter: Payer: Self-pay | Admitting: Internal Medicine

## 2016-12-23 VITALS — HR 108 | Ht 73.0 in | Wt 125.0 lb

## 2016-12-23 DIAGNOSIS — G4700 Insomnia, unspecified: Secondary | ICD-10-CM

## 2016-12-23 DIAGNOSIS — J449 Chronic obstructive pulmonary disease, unspecified: Secondary | ICD-10-CM | POA: Diagnosis not present

## 2016-12-23 DIAGNOSIS — J9611 Chronic respiratory failure with hypoxia: Secondary | ICD-10-CM | POA: Diagnosis not present

## 2016-12-23 MED ORDER — VENTOLIN HFA 108 (90 BASE) MCG/ACT IN AERS: INHALATION_SPRAY | RESPIRATORY_TRACT | 5 refills | Status: DC

## 2016-12-23 MED ORDER — TEMAZEPAM 7.5 MG PO CAPS: 7.5000 mg | ORAL_CAPSULE | Freq: Every day | ORAL | 0 refills | Status: AC

## 2016-12-23 MED ORDER — TIOTROPIUM BROMIDE MONOHYDRATE 2.5 MCG/ACT IN AERS: 2.0000 | INHALATION_SPRAY | Freq: Every day | RESPIRATORY_TRACT | 12 refills | Status: AC

## 2016-12-23 MED ORDER — BUDESONIDE-FORMOTEROL FUMARATE 160-4.5 MCG/ACT IN AERO: 2.0000 | INHALATION_SPRAY | Freq: Two times a day (BID) | RESPIRATORY_TRACT | 11 refills | Status: AC

## 2016-12-23 NOTE — Addendum Note (Signed)
Addended by: Collier Salina on: 12/23/2016 11:27 AM   Modules accepted: Orders
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
## 2016-12-23 NOTE — Patient Instructions (Signed)
ICD-10-CM   1. Chronic respiratory failure with hypoxia (HCC) J96.11   2. Stage 4 very severe COPD by GOLD classification ([place]) J44.9   3. Insomnia, unspecified type G47.00      #COPD  - stable thoiugh you are wheezing (will take your word you are not in flare up)  - continue spiriva but change to respimat - continue symbicort scheduled  - albuterol as needed  - 12 refills as you suggested on these 3 - continue night o2 and o2 with exertion  - [facility]et with [facility] to get lighter portable system - high dose flu shot 12/23/2016 - alpha 1 AT level 12/23/2016  #insomnia - restoril 5-10mg  as needed at bed time; 30 tablets - no refill. AFter this with PCP Hayden Rasmussen, MD  #Followup 3 months or soonr if needed  - CAT score at followup

## 2016-12-30 ENCOUNTER — Telehealth: Payer: Self-pay | Admitting: Internal Medicine

## 2016-12-30 NOTE — Telephone Encounter (Signed)
Plan:       #COPD  - stable thoiugh you are wheezing (will take your word you are not in flare up)  - continue spiriva but change to respimat - continue symbicort scheduled  - albuterol as needed             - 12 refills as you suggested on these 3 - continue night o2 and o2 with exertion             - meeet with [facility] to get lighter portable system - high dose flu shot 12/23/2016 - alpha 1 AT level 12/23/2016  #insomnia - restoril 5-10mg  as needed at bed time; 30 tablets - no refill. AFter this with PCP Hayden Rasmussen, MD  #Followup 3 months or soonr if needed             - CAT score at followup    Spoke with pt advised Rx for Restoril was called in to [facility]. MR can we refill prednisone? I didn't see in your to continue prednisone and wanted make sure with you first. Please advise.

## 2017-01-04 NOTE — Telephone Encounter (Signed)
MR, please advise. Thanks!  

## 2017-01-04 NOTE — Telephone Encounter (Signed)
Is he asking for short term prednisone for aecopd or long term maintenance prednisone. If latter, I was not aware at last visit he was on daily prednisone. If short term prednisone you can do Take prednisone 40 mg daily x 2 days, then 20mg  daily x 2 days, then 10mg  daily x 2 days, then 5mg  daily x 2 days and stop   Dr. Brand Males, M.D., [facility].C.P Pulmonary and Critical Care Medicine Staff Physician, [place] Director - Interstitial Lung Disease  Program  Pulmonary [place] [facility] [place], [place], [address]  Pager: [phone], If no answer or between  15:00h - 7:00h: call [phone] Telephone: [phone]

## 2017-01-04 NOTE — Telephone Encounter (Signed)
lmtcb x1 for pt. 

## 2017-01-06 NOTE — Telephone Encounter (Signed)
Spoke with pt, who states he is on another call and will call us back.  Will await call back

## 2017-01-11 MED ORDER — PREDNISONE 10 MG PO TABS: 10.0000 mg | ORAL_TABLET | Freq: Every day | ORAL | 12 refills | Status: AC

## 2017-01-11 NOTE — Telephone Encounter (Signed)
Rx for prednisone 10mg  has been sent to preferred pharmacy.  Pt is aware and voiced his understanding.  Nothing further needed.

## 2017-01-11 NOTE — Telephone Encounter (Signed)
Sure he can remain on prednisone 10mg  per day - ok to refill  Dr. Brand Males, M.D., [facility].C.P Pulmonary and Critical Care Medicine Staff Physician, [place] Director - Interstitial Lung Disease  Program  Pulmonary [place] at [place], [place], [address]  Pager: [phone], If no answer or between  15:00h - 7:00h: call [phone] Telephone: [phone]

## 2017-01-11 NOTE — Telephone Encounter (Signed)
LMOMTCB x 1 

## 2017-01-11 NOTE — Telephone Encounter (Signed)
Pt returning call.  The best number to reach him at is [phone]

## 2017-01-11 NOTE — Telephone Encounter (Signed)
MR he states he takes 10mg  daily for maintenance and would like a RX for a year. Can we send in?    [facility].

## 2017-07-29 ENCOUNTER — Other Ambulatory Visit: Payer: Self-pay | Admitting: Internal Medicine

## 2017-07-30 ENCOUNTER — Telehealth: Payer: Self-pay | Admitting: Internal Medicine

## 2017-07-30 NOTE — Telephone Encounter (Signed)
Per pt's last OV the plan is stated below:  #Followup 3 months or soonr if needed             - CAT score at followup  Pt needs an appt to be able to receive med refills.  Called and spoke with pt stating that we needed him to schedule an appt.  Pt stated to me at this time he has no desire to schedule an appt at our office. I stated to him per his last OV, it stated to f/u 3 months.  When stating that to pt, pt stated to me he sees no need to make an appt and at this time is not going to schedule one. Pt stated if one needs to be scheduled he would make in in November as that was when his last OV was.  Pt is requesting the appt change from the original plan of 3 months to be changed to 1 year from last OV as pt has no desire to come in until then.  If pt does need to be seen sooner as previous plan, please advise on that so that way we can make pt aware that a sooner appt than 1 year needs to be made.  Also, please advise if it is okay for [place] to refill pt's med as he is not currently wanting to make an appt.   MR, please advise on the above. Thanks!

## 2017-08-01 NOTE — Telephone Encounter (Signed)
Ok for 1 year annual appt - let him know  OK to do refill for a year

## 2017-08-02 MED ORDER — VENTOLIN HFA 108 (90 BASE) MCG/ACT IN AERS: INHALATION_SPRAY | RESPIRATORY_TRACT | 5 refills | Status: AC

## 2017-08-02 NOTE — Telephone Encounter (Signed)
Left message for patient to call back  

## 2017-08-02 NOTE — Telephone Encounter (Signed)
Pt is returning call. Cb is [phone].

## 2017-08-02 NOTE — Telephone Encounter (Signed)
Spoke with pt. He is aware of MR's response. Rx has been sent in. Recall has been placed for November. Nothing further was needed.

## 2017-10-20 ENCOUNTER — Telehealth: Payer: Self-pay | Admitting: Internal Medicine

## 2017-10-20 NOTE — Telephone Encounter (Signed)
Spoke with [employer]. Advised him that I would send this message over to Dr. Chase Caller for him. He verbalized understanding.   Dr. Chase Caller, please advise. Thanks!
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
## 2017-10-21 NOTE — Telephone Encounter (Signed)
Spoke with [employer]. He stated that he had received the information he needed. Nothing further needed at time of call.

## 2017-10-21 NOTE — Telephone Encounter (Signed)
My Nov 2018 Notes do not document him having foley. He was well clothed and visit was for copd into the pulmonary office so there was no discussion on urinary issues .  In dec 2016 he was discharged to [facility]; maybe they can call them to see if he had foley.

## 2017-11-09 DEATH — deceased
